# Patient Record
Sex: Male | Born: 1970 | Race: Black or African American | Hispanic: No | Smoking: Current some day smoker
Health system: Southern US, Community
[De-identification: ages and names within clinical notes are randomized; demographics above are authoritative.]

## PROBLEM LIST (undated history)

## (undated) DIAGNOSIS — J45909 Unspecified asthma, uncomplicated: Secondary | ICD-10-CM

---

## 2001-09-26 ENCOUNTER — Emergency Department (HOSPITAL_COMMUNITY): Admission: EM | Admit: 2001-09-26 | Discharge: 2001-09-26 | Payer: Self-pay | Admitting: Emergency Medicine

## 2001-09-26 ENCOUNTER — Encounter: Payer: Self-pay | Admitting: Emergency Medicine

## 2001-09-27 ENCOUNTER — Ambulatory Visit (HOSPITAL_BASED_OUTPATIENT_CLINIC_OR_DEPARTMENT_OTHER): Admission: RE | Admit: 2001-09-27 | Discharge: 2001-09-28 | Payer: Self-pay | Admitting: *Deleted

## 2002-10-12 ENCOUNTER — Encounter: Payer: Self-pay | Admitting: Emergency Medicine

## 2002-10-12 ENCOUNTER — Emergency Department (HOSPITAL_COMMUNITY): Admission: EM | Admit: 2002-10-12 | Discharge: 2002-10-13 | Payer: Self-pay | Admitting: Physical Therapy

## 2002-10-19 ENCOUNTER — Emergency Department (HOSPITAL_COMMUNITY): Admission: EM | Admit: 2002-10-19 | Discharge: 2002-10-19 | Payer: Self-pay | Admitting: Emergency Medicine

## 2003-08-13 ENCOUNTER — Other Ambulatory Visit: Payer: Self-pay

## 2008-04-03 ENCOUNTER — Emergency Department (HOSPITAL_COMMUNITY): Admission: EM | Admit: 2008-04-03 | Discharge: 2008-04-03 | Payer: Self-pay | Admitting: Emergency Medicine

## 2009-05-02 ENCOUNTER — Emergency Department (HOSPITAL_COMMUNITY): Admission: EM | Admit: 2009-05-02 | Discharge: 2009-05-02 | Payer: Self-pay | Admitting: Emergency Medicine

## 2009-12-11 ENCOUNTER — Emergency Department (HOSPITAL_COMMUNITY)
Admission: EM | Admit: 2009-12-11 | Discharge: 2009-12-11 | Payer: Self-pay | Source: Home / Self Care | Admitting: Emergency Medicine

## 2010-05-24 NOTE — Op Note (Signed)
   NAME:  ARISTIDE, WAGGLE                        ACCOUNT NO.:  [id number]   MEDICAL RECORD NO.:  [id number]                   PATIENT TYPE:  AMB   LOCATION:  DSC                                  FACILITY:  [facility]   PHYSICIAN:  Lowell Bouton, M.D.      DATE OF BIRTH:  [birth year]   DATE OF PROCEDURE:  09/27/2001  DATE OF DISCHARGE:                                 OPERATIVE REPORT   PREOPERATIVE DIAGNOSIS:  Abscess with extensor tenosynovitis, right middle  finger.   POSTOPERATIVE DIAGNOSIS:  Abscess with extensor tenosynovitis, right middle  finger.   PROCEDURE:  Incision and drainage of right middle finger.   SURGEON:  Lowell Bouton, M.D.   ANESTHESIA:  General.   OPERATIVE FINDINGS:  The patient had a typical clenched-fist injury;  however, the wound was over the dorsum of the proximal  phalanx of his right  middle finger.  The wound extended down through the extensor tendon to the  bone and actually made an indentation in the bone, but there was not a  discrete fracture.  There was gross purulent material surrounding a hole in  the extensor mechanism, and it tracked proximally and distally.   DESCRIPTION OF PROCEDURE:  Under general anesthesia with a tourniquet on the  right arm, the right hand was prepped and draped in the usual fashion.  After elevating the limb, the tourniquet was inflated to 225 mmHg.  A  longitudinal incision was made in the midline on the dorsum of the proximal  phalanx of the right middle finger.  Blunt dissection was carried through  the subcutaneous tissues and the hole in the extensor tendon was easily  identifiable.  There was gross purulent material beneath the extensor  mechanism, and cultures were obtained for aerobes and anaerobes.  The finger  was flexed and extended and there was an indentation in the bone, however  not an obvious fracture.  After releasing all the purulent material, the  wound was irrigated copiously  with antibiotic solution.  It was packed with  iodoform packing beneath the tendon to allow for further drainage.  The  distal and proximal end of the wound was closed with 4-0 nylon sutures.  Sterile dressings were applied, followed by an Alumafoam splint, and the  patient tolerated the procedure well and went to the recovery room awake and  stable, in good condition.  A 0.5% Marcaine digital block was given for pain  control following the procedure.                                               Lowell Bouton, M.D.    EMM/MEDQ  D:  09/27/2001  T:  09/28/2001  Job:  [id number]

## 2011-01-21 ENCOUNTER — Encounter (HOSPITAL_COMMUNITY): Payer: Self-pay | Admitting: *Deleted

## 2011-01-21 ENCOUNTER — Emergency Department (HOSPITAL_COMMUNITY): Payer: Medicaid Other

## 2011-01-21 ENCOUNTER — Emergency Department (HOSPITAL_COMMUNITY)
Admission: EM | Admit: 2011-01-21 | Discharge: 2011-01-21 | Disposition: A | Discharge status: Home / Self Care | Payer: Medicaid Other | Attending: Emergency Medicine | Admitting: Emergency Medicine

## 2011-01-21 ENCOUNTER — Other Ambulatory Visit: Payer: Self-pay

## 2011-01-21 DIAGNOSIS — M25529 Pain in unspecified elbow: Secondary | ICD-10-CM

## 2011-01-21 DIAGNOSIS — R29898 Other symptoms and signs involving the musculoskeletal system: Secondary | ICD-10-CM | POA: Insufficient documentation

## 2011-01-21 DIAGNOSIS — R209 Unspecified disturbances of skin sensation: Secondary | ICD-10-CM | POA: Insufficient documentation

## 2011-01-21 DIAGNOSIS — M25519 Pain in unspecified shoulder: Secondary | ICD-10-CM | POA: Insufficient documentation

## 2011-01-21 DIAGNOSIS — R200 Anesthesia of skin: Secondary | ICD-10-CM

## 2011-01-21 LAB — BASIC METABOLIC PANEL
CO2: 27 mEq/L (ref 19–32)
Calcium: 9.2 mg/dL (ref 8.4–10.5)
GFR calc non Af Amer: >90 mL/min (ref >90)
Potassium: 3.6 mEq/L (ref 3.5–5.1)
Sodium: 141 mEq/L (ref 135–145)

## 2011-01-21 LAB — DIFFERENTIAL
Basophils Absolute: 0 10*3/uL (ref 0.0–0.1)
Eosinophils Relative: 4 % (ref 0–5)
Lymphocytes Relative: 36 % (ref 12–46)
Lymphs Abs: 1.7 10*3/uL (ref 0.7–4.0)
Neutrophils Relative %: 52 % (ref 43–77)

## 2011-01-21 LAB — CBC
MCV: 93.5 fL (ref 78.0–100.0)
Platelets: 147 10*3/uL — ABNORMAL LOW (ref 150–400)
RBC: 4.44 MIL/uL (ref 4.22–5.81)
RDW: 13 % (ref 11.5–15.5)
WBC: 4.8 10*3/uL (ref 4.0–10.5)

## 2011-01-21 MED ORDER — OXYCODONE-ACETAMINOPHEN 5-325 MG PO TABS: 1.0000 | ORAL_TABLET | ORAL | Status: AC | PRN

## 2011-01-21 MED ORDER — ASPIRIN 325 MG PO TABS
325.0000 mg | ORAL_TABLET | Freq: Every day | ORAL | Status: DC
  Administered 2011-01-21: 325 mg via ORAL
  Filled 2011-01-21: qty 1

## 2011-01-21 MED ORDER — IBUPROFEN 600 MG PO TABS: 600.0000 mg | ORAL_TABLET | Freq: Four times a day (QID) | ORAL | Status: AC | PRN

## 2011-01-21 MED ORDER — ACETAMINOPHEN 325 MG PO TABS: 650.0000 mg | ORAL_TABLET | ORAL | Status: DC | PRN

## 2011-01-21 MED ORDER — SODIUM CHLORIDE 0.9 % IV SOLN: INTRAVENOUS | Status: DC

## 2011-01-21 NOTE — ED Notes (Signed)
Pt woke up with left shoulder and elbow pain and has pain to raise arm and sts left leg has numb feeling and he woke up with the pain.  No headache or facial deficits

## 2011-01-21 NOTE — ED Provider Notes (Signed)
Pt with numbness to left UE/left LE and reported weakness to left UE this morning He woke up with these symptoms He is awake/alert at this time No arm drift noted but reports numbness No HA/neck/back pain reported BP 127/69  Pulse 80  Temp(Src) 98.5 F (36.9 C) (Oral)  Resp 20  SpO2 99%   Joya Gaskins, MD 01/21/11 1159

## 2011-01-21 NOTE — ED Provider Notes (Signed)
History     CSN: [id number]  Arrival date & time 01/21/11  1057   First MD Initiated Contact with Patient 01/21/11 1146      Chief Complaint  Patient presents with  . Numbness    shoulder and elbow pain    (Consider location/radiation/quality/duration/timing/severity/associated sxs/prior treatment) HPI  Previously healthy presents with multiple complaints. Patient states he woke up at 9:30 this morning and experienced left upper extremity left lower extremity numbness. The numbness is diffuse. He also complains of paresthesias to the same length. He states he has mild pain in his left shoulder and left elbow but no pain in his left lower extremity. Denies headache, dizziness. Denies numbness tingling or weakness in his face. He remains ambulatory. No history of similar. There's been no history of head trauma. He denies back pain, neck pain. Denies known recent tick bite.   ED Notes, ED Provider Notes from 01/21/11 0000 to 01/21/11 11:21:31       Sabrina Tommy Medal, RN 01/21/2011 11:20      Pt woke up with left shoulder and elbow pain and has pain to raise arm and sts left leg has numb feeling and he woke up with the pain. No headache or facial deficits     History reviewed. No pertinent past medical history.  History reviewed. No pertinent past surgical history.  No family history on file.  History  Substance Use Topics  . Smoking status: Current Everyday Smoker  . Smokeless tobacco: Not on file  . Alcohol Use: Yes     occ      Review of Systems  All other systems reviewed and are negative.   except as noted HPI    Allergies  Review of patient's allergies indicates no known allergies.  Home Medications   Current Outpatient Rx  Name Route Sig Dispense Refill  . IBUPROFEN 600 MG PO TABS Oral Take 1 tablet (600 mg total) by mouth every 6 (six) hours as needed for pain. 30 tablet 0  . OXYCODONE-ACETAMINOPHEN 5-325 MG PO TABS Oral Take 1 tablet by mouth every  4 (four) hours as needed for pain. 6 tablet 0    BP 134/95  Pulse 68  Temp(Src) 97.6 F (36.4 C) (Oral)  Resp 16  SpO2 99%  Physical Exam  Nursing note and vitals reviewed. Constitutional: He is oriented to person, place, and time. He appears well-developed and well-nourished. No distress.  HENT:  Head: Atraumatic.  Mouth/Throat: Oropharynx is clear and moist.  Eyes: Conjunctivae are normal. Pupils are equal, round, and reactive to light.  Neck: Neck supple.  Cardiovascular: Normal rate, regular rhythm, normal heart sounds and intact distal pulses.  Exam reveals no gallop and no friction rub.   No murmur heard. Pulmonary/Chest: Effort normal. No respiratory distress. He has no wheezes. He has no rales.  Abdominal: Soft. Bowel sounds are normal. There is no tenderness. There is no rebound and no guarding.  Musculoskeletal: Normal range of motion. He exhibits no edema and no tenderness.       Lt elbow and shoulder with full ROM min pain.   No midline c/t/l/s ttp   Neurological: He is alert and oriented to person, place, and time. No cranial nerve deficit. He exhibits normal muscle tone. Coordination normal.       Strength 5/5 RUE/RLE  4+/5 LUE/LLE all extremities No pronator drift No facial droop Decreased gross sensation diffusely LUE/LLE   Skin: Skin is warm and dry.  Psychiatric: He has a normal  mood and affect.    ED Course  Procedures (including critical care time)  Labs Reviewed  CBC - Abnormal; Notable for the following:    Platelets 147 (*)    All other components within normal limits  DIFFERENTIAL  BASIC METABOLIC PANEL  LAB REPORT - SCANNED   Dg Elbow Complete Left  01/21/2011  *RADIOLOGY REPORT*  Clinical Data: Left elbow pain  LEFT ELBOW - COMPLETE 3+ VIEW  Comparison: None  Findings: No joint effusion.  There is no fracture or subluxation.  No radio-opaque foreign body or soft tissue calcification.  IMPRESSION:  1.  No acute findings identified.  Original  Report Authenticated By: Rosealee Albee, M.D.   Ct Head Wo Contrast  01/21/2011  *RADIOLOGY REPORT*  Clinical Data: Left upper and lower extremity numbness and weakness.  CT HEAD WITHOUT CONTRAST  Technique:  Contiguous axial images were obtained from the base of the skull through the vertex without contrast.  Comparison: 05/02/2009  Findings: There is no acute intracranial infarction, mass lesion, or hemorrhage.  Brain parenchyma is normal.  The osseous structures are normal.  IMPRESSION: Normal exam.  Original Report Authenticated By: Gwynn Burly, M.D.   Mr Brain Wo Contrast  01/21/2011  *RADIOLOGY REPORT*  Clinical Data: 41 year old male with left side weakness.  Left upper and lower extremity involvement.  MRI HEAD WITHOUT CONTRAST  Technique:  Multiplanar, multiecho pulse sequences of the brain and surrounding structures were obtained according to standard protocol without intravenous contrast.  Comparison: Head CTs without contrast 01/21/2011 and earlier.  Findings: Mild scaphocephaly. No restricted diffusion to suggest acute infarction.  No midline shift, mass effect, evidence of mass lesion, ventriculomegaly, extra-axial collection or acute intracranial hemorrhage.  Cervicomedullary junction and pituitary are within normal limits.  Major intracranial vascular flow voids are preserved; the vertebrobasilar junction and or distal left vertebral body are tortuous in the pre medullary cistern. Wallace Cullens and white matter signal is within normal limits throughout the brain. Negative visualized cervical spine.  Normal bone marrow signal.  Visualized orbit soft tissues are within normal limits.  Mild paranasal sinus mucosal thickening.  Mastoids are clear.  Negative scalp soft tissues.  IMPRESSION: Normal noncontrast MRI appearance of the brain.  Original Report Authenticated By: Ulla Potash III, M.D.     1. Numbness   2. Elbow pain     MDM  Patient with atypical numbness/tingling/pain of LUE and LLE.  Suspect weakness is mostly effort related. MRI negative for acute stroke. No neck pain and doubt cervical etiology. XR elbow negative. Patient given referral for follow up with primary care should sx persist. Precautions for return.        Forbes Cellar, MD 01/22/11 1000

## 2011-01-21 NOTE — ED Notes (Signed)
LSN 0230. Awoke this am 0900 with LUE, LLE weakness, numbness, tingling & pain. No other defecits. No facial droop, slurred speech.

## 2011-01-21 NOTE — ED Notes (Signed)
Vascular lab aware of pt.

## 2011-01-21 NOTE — ED Notes (Signed)
Pt states  He woke up w/ left shoulder tingling and pain in elbow this am also states that left leg feels funny.ble to walk but states is slow and has good pulses can make a fist and relax it has good radial pulse

## 2011-01-21 NOTE — ED Notes (Signed)
Patient transported to X-ray 

## 2011-10-04 ENCOUNTER — Encounter (HOSPITAL_COMMUNITY): Payer: Self-pay | Admitting: *Deleted

## 2011-10-04 ENCOUNTER — Emergency Department (HOSPITAL_COMMUNITY)
Admission: EM | Admit: 2011-10-04 | Discharge: 2011-10-05 | Disposition: A | Discharge status: Home / Self Care | Payer: Medicaid Other | Attending: Emergency Medicine | Admitting: Emergency Medicine

## 2011-10-04 DIAGNOSIS — T148XXA Other injury of unspecified body region, initial encounter: Secondary | ICD-10-CM

## 2011-10-04 DIAGNOSIS — X500XXA Overexertion from strenuous movement or load, initial encounter: Secondary | ICD-10-CM | POA: Insufficient documentation

## 2011-10-04 DIAGNOSIS — F17200 Nicotine dependence, unspecified, uncomplicated: Secondary | ICD-10-CM | POA: Insufficient documentation

## 2011-10-04 NOTE — ED Notes (Signed)
The pt has had lower back pain for 3 days with no injury.  He also has pain in both his legs from the knees up.  No previous history.  He has been lefting heavy  objects

## 2011-10-04 NOTE — ED Notes (Signed)
Pt stated that he has been having back pain x 3days. Pain radiates to inner thighs. He was picking up a tv prior to back pain. No numbness and tinglin noted. Pt ambulatory and can move legs. Will continue to monitor.

## 2011-10-05 MED ORDER — CYCLOBENZAPRINE HCL 10 MG PO TABS: 10.0000 mg | ORAL_TABLET | Freq: Two times a day (BID) | ORAL | Status: DC | PRN

## 2011-10-05 MED ORDER — IBUPROFEN 600 MG PO TABS: 600.0000 mg | ORAL_TABLET | Freq: Four times a day (QID) | ORAL | Status: DC | PRN

## 2011-10-05 MED ORDER — KETOROLAC TROMETHAMINE 30 MG/ML IJ SOLN
30.0000 mg | Freq: Once | INTRAMUSCULAR | Status: AC
  Administered 2011-10-05: 30 mg via INTRAMUSCULAR
  Filled 2011-10-05: qty 1

## 2011-10-05 NOTE — ED Provider Notes (Signed)
History     CSN: [id number]  Arrival date & time 10/04/11  2209   First MD Initiated Contact with Patient 10/05/11 0104      Chief Complaint  Patient presents with  . Back Pain    (Consider location/radiation/quality/duration/timing/severity/associated sxs/prior treatment) HPI Comments: After lifting a big screen TV with a friend developed low back pain 3 days ago has not taken any meds applied heat or cold  Patient was sleeping soundly when I entered the room   Patient is a 41 y.o. male presenting with back pain. The history is provided by the patient.  Back Pain  This is a new problem. The current episode started more than 2 days ago. The problem has not changed since onset.The pain is associated with lifting heavy objects. The pain is present in the lumbar spine. The pain radiates to the left thigh and right thigh. The pain is at a severity of 4/10. The pain is moderate. The symptoms are aggravated by bending. Associated symptoms include numbness and dysuria. Pertinent negatives include no fever, no headaches and no weakness.    History reviewed. No pertinent past medical history.  History reviewed. No pertinent past surgical history.  No family history on file.  History  Substance Use Topics  . Smoking status: Current Every Day Smoker  . Smokeless tobacco: Not on file  . Alcohol Use: Yes     occ      Review of Systems  Constitutional: Negative for fever and chills.  Genitourinary: Positive for dysuria. Negative for frequency.  Musculoskeletal: Positive for back pain. Negative for joint swelling and gait problem.  Neurological: Positive for numbness. Negative for dizziness, weakness and headaches.    Allergies  Review of patient's allergies indicates no known allergies.  Home Medications   Current Outpatient Rx  Name Route Sig Dispense Refill  . CYCLOBENZAPRINE HCL 10 MG PO TABS Oral Take 1 tablet (10 mg total) by mouth 2 (two) times daily as needed for muscle  spasms. 20 tablet 0  . IBUPROFEN 600 MG PO TABS Oral Take 1 tablet (600 mg total) by mouth every 6 (six) hours as needed for pain. 30 tablet 0    BP 130/78  Temp 98.8 F (37.1 C) (Oral)  Resp 18  SpO2 99%  Physical Exam  Constitutional: He is oriented to person, place, and time. He appears well-developed and well-nourished.  Eyes: Pupils are equal, round, and reactive to light.  Neck: Normal range of motion.  Pulmonary/Chest: Effort normal.  Abdominal: Soft.  Musculoskeletal: Normal range of motion. He exhibits no edema and no tenderness.       Lumbar back: He exhibits tenderness. He exhibits normal range of motion, no swelling, no edema, no laceration, no pain and no spasm.       Back:       Legs:      normal gait   Neurological: He is alert and oriented to person, place, and time.  Skin: No rash noted.    ED Course  Procedures (including critical care time)  Labs Reviewed - No data to display No results found.   1. Muscle strain       MDM  Muscle strain         Arman Filter, NP 10/05/11 0129

## 2011-10-06 NOTE — ED Provider Notes (Signed)
Medical screening examination/treatment/procedure(s) were performed by non-physician practitioner and as supervising physician I was immediately available for consultation/collaboration.  Juliet Rude. Rubin Payor, MD 10/06/11 2137

## 2011-10-27 ENCOUNTER — Emergency Department (HOSPITAL_COMMUNITY): Payer: No Typology Code available for payment source

## 2011-10-27 ENCOUNTER — Encounter (HOSPITAL_COMMUNITY): Payer: Self-pay | Admitting: Emergency Medicine

## 2011-10-27 ENCOUNTER — Encounter (HOSPITAL_COMMUNITY): Payer: Self-pay | Admitting: *Deleted

## 2011-10-27 ENCOUNTER — Emergency Department (HOSPITAL_COMMUNITY)
Admission: EM | Admit: 2011-10-27 | Discharge: 2011-10-27 | Disposition: A | Discharge status: Home / Self Care | Payer: No Typology Code available for payment source | Attending: Emergency Medicine | Admitting: Emergency Medicine

## 2011-10-27 DIAGNOSIS — R079 Chest pain, unspecified: Secondary | ICD-10-CM | POA: Insufficient documentation

## 2011-10-27 DIAGNOSIS — Y9389 Activity, other specified: Secondary | ICD-10-CM | POA: Insufficient documentation

## 2011-10-27 DIAGNOSIS — Y939 Activity, unspecified: Secondary | ICD-10-CM | POA: Insufficient documentation

## 2011-10-27 DIAGNOSIS — F17200 Nicotine dependence, unspecified, uncomplicated: Secondary | ICD-10-CM | POA: Insufficient documentation

## 2011-10-27 DIAGNOSIS — M542 Cervicalgia: Secondary | ICD-10-CM | POA: Insufficient documentation

## 2011-10-27 DIAGNOSIS — R51 Headache: Secondary | ICD-10-CM | POA: Insufficient documentation

## 2011-10-27 DIAGNOSIS — S161XXA Strain of muscle, fascia and tendon at neck level, initial encounter: Secondary | ICD-10-CM

## 2011-10-27 DIAGNOSIS — R0781 Pleurodynia: Secondary | ICD-10-CM

## 2011-10-27 DIAGNOSIS — S139XXA Sprain of joints and ligaments of unspecified parts of neck, initial encounter: Secondary | ICD-10-CM | POA: Insufficient documentation

## 2011-10-27 DIAGNOSIS — S20219A Contusion of unspecified front wall of thorax, initial encounter: Secondary | ICD-10-CM

## 2011-10-27 DIAGNOSIS — IMO0002 Reserved for concepts with insufficient information to code with codable children: Secondary | ICD-10-CM | POA: Insufficient documentation

## 2011-10-27 DIAGNOSIS — S46819A Strain of other muscles, fascia and tendons at shoulder and upper arm level, unspecified arm, initial encounter: Secondary | ICD-10-CM

## 2011-10-27 MED ORDER — METHOCARBAMOL 500 MG PO TABS: 500.0000 mg | ORAL_TABLET | Freq: Two times a day (BID) | ORAL | Status: DC

## 2011-10-27 MED ORDER — KETOROLAC TROMETHAMINE 60 MG/2ML IM SOLN
60.0000 mg | Freq: Once | INTRAMUSCULAR | Status: AC
  Administered 2011-10-27: 60 mg via INTRAMUSCULAR
  Filled 2011-10-27: qty 2

## 2011-10-27 MED ORDER — KETOROLAC TROMETHAMINE 60 MG/2ML IM SOLN
30.0000 mg | Freq: Once | INTRAMUSCULAR | Status: AC
  Administered 2011-10-27: 30 mg via INTRAMUSCULAR
  Filled 2011-10-27: qty 2

## 2011-10-27 MED ORDER — KETOROLAC TROMETHAMINE 30 MG/ML IJ SOLN: 30.0000 mg | Freq: Once | INTRAMUSCULAR | Status: DC

## 2011-10-27 MED ORDER — NAPROXEN 500 MG PO TABS: 500.0000 mg | ORAL_TABLET | Freq: Two times a day (BID) | ORAL | Status: DC
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
## 2011-10-27 NOTE — ED Notes (Signed)
To CT via stretcher, alert, NAD, calm, interactive, sitting upright, c-collar in place.
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
## 2011-10-29 ENCOUNTER — Emergency Department (INDEPENDENT_AMBULATORY_CARE_PROVIDER_SITE_OTHER)
Admission: EM | Admit: 2011-10-29 | Discharge: 2011-10-29 | Disposition: A | Discharge status: Home / Self Care | Payer: Self-pay | Source: Home / Self Care | Attending: Family Medicine | Admitting: Family Medicine

## 2011-10-29 ENCOUNTER — Encounter (HOSPITAL_COMMUNITY): Payer: Self-pay | Admitting: *Deleted

## 2011-10-29 DIAGNOSIS — Z041 Encounter for examination and observation following transport accident: Secondary | ICD-10-CM

## 2011-10-29 DIAGNOSIS — IMO0001 Reserved for inherently not codable concepts without codable children: Secondary | ICD-10-CM

## 2011-10-29 MED ORDER — CYCLOBENZAPRINE HCL 5 MG PO TABS: 5.0000 mg | ORAL_TABLET | Freq: Three times a day (TID) | ORAL | Status: DC | PRN
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
## 2012-02-24 ENCOUNTER — Encounter (HOSPITAL_COMMUNITY): Payer: Self-pay | Admitting: *Deleted

## 2012-02-24 ENCOUNTER — Emergency Department (INDEPENDENT_AMBULATORY_CARE_PROVIDER_SITE_OTHER)
Admission: EM | Admit: 2012-02-24 | Discharge: 2012-02-24 | Disposition: A | Discharge status: Home / Self Care | Payer: Self-pay | Source: Home / Self Care | Attending: Emergency Medicine | Admitting: Emergency Medicine

## 2012-02-24 DIAGNOSIS — S139XXA Sprain of joints and ligaments of unspecified parts of neck, initial encounter: Secondary | ICD-10-CM

## 2012-02-24 DIAGNOSIS — S39012A Strain of muscle, fascia and tendon of lower back, initial encounter: Secondary | ICD-10-CM

## 2012-02-24 DIAGNOSIS — S161XXA Strain of muscle, fascia and tendon at neck level, initial encounter: Secondary | ICD-10-CM

## 2012-02-24 DIAGNOSIS — S46019A Strain of muscle(s) and tendon(s) of the rotator cuff of unspecified shoulder, initial encounter: Secondary | ICD-10-CM

## 2012-02-24 MED ORDER — TRAMADOL HCL 50 MG PO TABS: 100.0000 mg | ORAL_TABLET | Freq: Three times a day (TID) | ORAL | Status: DC | PRN

## 2012-02-24 MED ORDER — MELOXICAM 15 MG PO TABS: 15.0000 mg | ORAL_TABLET | Freq: Every day | ORAL | Status: DC

## 2012-02-24 MED ORDER — METHOCARBAMOL 500 MG PO TABS: 500.0000 mg | ORAL_TABLET | Freq: Three times a day (TID) | ORAL | Status: DC
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
## 2012-04-06 ENCOUNTER — Encounter (HOSPITAL_COMMUNITY): Payer: Self-pay | Admitting: *Deleted

## 2012-04-06 ENCOUNTER — Emergency Department (HOSPITAL_COMMUNITY)
Admission: EM | Admit: 2012-04-06 | Discharge: 2012-04-06 | Disposition: A | Discharge status: Home / Self Care | Payer: Medicaid Other | Attending: Emergency Medicine | Admitting: Emergency Medicine

## 2012-04-06 DIAGNOSIS — Z202 Contact with and (suspected) exposure to infections with a predominantly sexual mode of transmission: Secondary | ICD-10-CM

## 2012-04-06 DIAGNOSIS — F17200 Nicotine dependence, unspecified, uncomplicated: Secondary | ICD-10-CM | POA: Insufficient documentation

## 2012-04-06 LAB — URINALYSIS, ROUTINE W REFLEX MICROSCOPIC
Nitrite: NEGATIVE
Specific Gravity, Urine: >1.03 — ABNORMAL HIGH (ref 1.005–1.030)
Urobilinogen, UA: 0.2 mg/dL (ref 0.0–1.0)
pH: 6 (ref 5.0–8.0)

## 2012-04-06 MED ORDER — METRONIDAZOLE 500 MG PO TABS
2000.0000 mg | ORAL_TABLET | Freq: Once | ORAL | Status: AC
  Administered 2012-04-06: 2000 mg via ORAL
  Filled 2012-04-06: qty 4

## 2012-04-06 NOTE — ED Notes (Signed)
Pt presents to er with request for evaluation of trichomonas, was advised by a girlfriend that she was treated for trichomonas on 04/03/2012. Pt denies any symptoms at present,

## 2012-04-08 LAB — GC/CHLAMYDIA PROBE AMP
CT Probe RNA: NEGATIVE
GC Probe RNA: NEGATIVE
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
## 2012-04-10 NOTE — ED Provider Notes (Signed)
Medical screening examination/treatment/procedure(s) were performed by non-physician practitioner and as supervising physician I was immediately available for consultation/collaboration.   Naysa Puskas M Dishawn Bhargava, DO 04/10/12 1348 
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
## 2014-01-25 ENCOUNTER — Encounter (HOSPITAL_COMMUNITY): Payer: Self-pay | Admitting: Physical Medicine and Rehabilitation

## 2014-01-25 ENCOUNTER — Emergency Department (HOSPITAL_COMMUNITY)
Admission: EM | Admit: 2014-01-25 | Discharge: 2014-01-25 | Disposition: A | Discharge status: Home / Self Care | Payer: Medicaid Other | Attending: Emergency Medicine | Admitting: Emergency Medicine

## 2014-01-25 DIAGNOSIS — A64 Unspecified sexually transmitted disease: Secondary | ICD-10-CM | POA: Insufficient documentation

## 2014-01-25 DIAGNOSIS — Z720 Tobacco use: Secondary | ICD-10-CM | POA: Insufficient documentation

## 2014-01-25 DIAGNOSIS — R300 Dysuria: Secondary | ICD-10-CM | POA: Diagnosis present

## 2014-01-25 LAB — URINALYSIS, ROUTINE W REFLEX MICROSCOPIC
BILIRUBIN URINE: NEGATIVE
Glucose, UA: NEGATIVE mg/dL
HGB URINE DIPSTICK: NEGATIVE
Ketones, ur: NEGATIVE mg/dL
NITRITE: NEGATIVE
PH: 6 (ref 5.0–8.0)
Protein, ur: NEGATIVE mg/dL
SPECIFIC GRAVITY, URINE: 1.028 (ref 1.005–1.030)
UROBILINOGEN UA: 1 mg/dL (ref 0.0–1.0)

## 2014-01-25 LAB — URINE MICROSCOPIC-ADD ON

## 2014-01-25 MED ORDER — LIDOCAINE HCL (PF) 1 % IJ SOLN
INTRAMUSCULAR | Status: AC
  Administered 2014-01-25: 5 mL
  Filled 2014-01-25: qty 5

## 2014-01-25 MED ORDER — CEFTRIAXONE SODIUM 250 MG IJ SOLR
250.0000 mg | Freq: Once | INTRAMUSCULAR | Status: AC
  Administered 2014-01-25: 250 mg via INTRAMUSCULAR
  Filled 2014-01-25: qty 250

## 2014-01-25 MED ORDER — AZITHROMYCIN 250 MG PO TABS
1000.0000 mg | ORAL_TABLET | Freq: Once | ORAL | Status: AC
  Administered 2014-01-25: 1000 mg via ORAL
  Filled 2014-01-25: qty 4

## 2014-01-25 NOTE — ED Notes (Signed)
Pt states girlfriend told him she could have STD, pt reports dysuria and difficulty urinating x2 days. Denies penile discharge.

## 2014-01-25 NOTE — ED Provider Notes (Signed)
CSN: [id number]     Arrival date & time 01/25/14  1418 History  This chart was scribed for Hoy Morn, MD by Molli Posey, ED Scribe. This patient was seen in room TR09C/TR09C and the patient's care was started 3:21 PM.     Chief Complaint  Patient presents with  . Dysuria  . Exposure to STD   The history is provided by the patient. No language interpreter was used.   HPI Comments: Sean Mcmahon is a 44 y.o. male who presents to the Emergency Department complaining of dysuria that started yesterday. Pt describes the sensation as a tingling feeling and says it occurs every time he urinates. Pt reports associated itchiness. He states he experienced some left sided back pain this morning that was new as well. Pt reports no new sexual partners. He states he has one current sexual partner that he has had for the last year, but could have had an exposure to an STD. Pt reports he has not used any new soaps or detergents. Pt denies penile discharge, testicular pain and abdominal pain.   History reviewed. No pertinent past medical history. History reviewed. No pertinent past surgical history. History reviewed. No pertinent family history. History  Substance Use Topics  . Smoking status: Current Every Day Smoker -- 0.25 packs/day    Types: Cigarettes  . Smokeless tobacco: Not on file  . Alcohol Use: Yes     Comment: occ    Review of Systems  Genitourinary: Positive for dysuria.  All other systems reviewed and are negative.   Allergies  Bee venom  Home Medications   Prior to Admission medications   Not on File   BP 141/80 mmHg  Pulse 98  Temp(Src) 98.1 F (36.7 C) (Oral)  Resp 18  Ht 5' 9"  (1.753 m)  Wt 215 lb (97.523 kg)  BMI 31.74 kg/m2  SpO2 100% Physical Exam  Constitutional: He is oriented to person, place, and time. He appears well-developed and well-nourished.  HENT:  Head: Normocephalic and atraumatic.  Eyes: Right eye exhibits no discharge. Left eye exhibits  no discharge.  Neck: Neck supple. No tracheal deviation present.  Cardiovascular: Normal rate.   Pulmonary/Chest: Effort normal. No respiratory distress.  Abdominal: He exhibits no distension.  Genitourinary: Testes normal and penis normal. Circumcised.  No redness swelling or drainage noted.   Neurological: He is alert and oriented to person, place, and time.  Skin: Skin is warm and dry.  Psychiatric: He has a normal mood and affect. His behavior is normal.  Nursing note and vitals reviewed.   ED Course  Procedures   DIAGNOSTIC STUDIES: Oxygen Saturation is 100% on RA, normal by my interpretation.    COORDINATION OF CARE: 3:24 PM Discussed treatment plan with pt at bedside and pt agreed to plan.   Labs Review Labs Reviewed  URINALYSIS, ROUTINE W REFLEX MICROSCOPIC - Abnormal; Notable for the following:    Color, Urine AMBER (*)    Leukocytes, UA TRACE (*)    All other components within normal limits  URINE CULTURE  URINE MICROSCOPIC-ADD ON  GC/CHLAMYDIA PROBE AMP ([place])    Imaging Review No results found.   EKG Interpretation None      MDM   Final diagnoses:  STD (sexually transmitted disease)   44 yo with dysuria and possible exposure to STD. Discussed importance of using protection when sexually active. Pt understands that they have GC/Chlamydia cultures pending and that they will need to inform all sexual partners  if results return positive. Pt has been treated prophylacticly with azithromycin and rocephin due to pt's history. He has no testicular pain or swelling and no abd pain. Pt is well-appearing, in no acute distress and vital signs reviewed and non-concerning. They appear safe to be discharged.  Discharge include follow-up with Health Department.  Return precautions provided.    I personally performed the services described in this documentation, which was scribed in my presence. The recorded information has been reviewed and is accurate.  Filed  Vitals:   01/25/14 1422 01/25/14 1541  BP: 141/80 132/77  Pulse: 98 82  Temp: 98.1 F (36.7 C) 99 F (37.2 C)  TempSrc: Oral Oral  Resp: 18 16  Height: 5' 9"  (1.753 m)   Weight: 215 lb (97.523 kg)   SpO2: 100% 99%   Meds given in ED:  Medications  cefTRIAXone (ROCEPHIN) injection 250 mg (250 mg Intramuscular Given 01/25/14 1549)  azithromycin (ZITHROMAX) tablet 1,000 mg (1,000 mg Oral Given 01/25/14 1549)  lidocaine (PF) (XYLOCAINE) 1 % injection (5 mLs  Given 01/25/14 1549)    There are no discharge medications for this patient.      Britt Bottom, NP 01/26/14 [place], MD 01/26/14 [phone]

## 2014-01-25 NOTE — Discharge Instructions (Signed)
Follow the directions provided. You've been treated for sexual transmitted disease today however the results of those tests will not come back for several days. If they're positive you will should receive a phone call to tell you the results. If the test results are positive you need to tell any sexual partners you have. Please always wear protection when having sexual intercourse. You may take Tylenol or ibuprofen for any pain or discomfort. Don't hesitate to return for any new, worsening, or concerning symptoms.   WHEN SHOULD I GET IMMEDIATE MEDICAL CARE?  Contact your health care provider right away if:  You have severe abdominal pain.  You are a man and notice swelling or pain in your testicles.  SEEK IMMEDIATE MEDICAL CARE IF:  Back pain develops.  A fever develops.  There is nausea (feeling sick to your stomach) or vomiting (throwing up).  Problems are no better with medications or are getting worse.

## 2014-01-26 LAB — GC/CHLAMYDIA PROBE AMP (~~LOC~~) NOT AT ARMC
CHLAMYDIA, DNA PROBE: NEGATIVE
Neisseria Gonorrhea: NEGATIVE

## 2014-01-29 LAB — URINE CULTURE: Colony Count: 25000

## 2014-01-31 ENCOUNTER — Telehealth (HOSPITAL_COMMUNITY): Payer: Self-pay

## 2014-01-31 NOTE — Telephone Encounter (Signed)
Post ED Visit - Positive Culture Follow-up  Culture report reviewed by antimicrobial stewardship pharmacist: []  [place], Pharm.D., BCPS [x]  Heide Guile, Pharm.D., BCPS []  Alycia Rossetti, Pharm.D., BCPS []  [place], Pharm.D., BCPS, AAHIVP []  Legrand Como, Pharm.D., BCPS, AAHIVP []  Isac Sarna, Pharm.D., BCPS  Positive Urine culture, 25,000 colonies -> Staph species No further patient follow-up is required at this time.  Dortha Kern 01/31/2014, 6:11 AM

## 2014-08-04 ENCOUNTER — Encounter (HOSPITAL_COMMUNITY): Payer: Self-pay | Admitting: Emergency Medicine

## 2014-08-04 ENCOUNTER — Emergency Department (HOSPITAL_COMMUNITY)
Admission: EM | Admit: 2014-08-04 | Discharge: 2014-08-04 | Disposition: A | Discharge status: Home / Self Care | Payer: Medicaid Other | Attending: Emergency Medicine | Admitting: Emergency Medicine

## 2014-08-04 DIAGNOSIS — Z202 Contact with and (suspected) exposure to infections with a predominantly sexual mode of transmission: Secondary | ICD-10-CM | POA: Diagnosis not present

## 2014-08-04 DIAGNOSIS — J069 Acute upper respiratory infection, unspecified: Secondary | ICD-10-CM | POA: Insufficient documentation

## 2014-08-04 DIAGNOSIS — R369 Urethral discharge, unspecified: Secondary | ICD-10-CM | POA: Diagnosis present

## 2014-08-04 DIAGNOSIS — Z711 Person with feared health complaint in whom no diagnosis is made: Secondary | ICD-10-CM

## 2014-08-04 DIAGNOSIS — J45909 Unspecified asthma, uncomplicated: Secondary | ICD-10-CM | POA: Insufficient documentation

## 2014-08-04 DIAGNOSIS — Z720 Tobacco use: Secondary | ICD-10-CM | POA: Insufficient documentation

## 2014-08-04 DIAGNOSIS — B9789 Other viral agents as the cause of diseases classified elsewhere: Secondary | ICD-10-CM

## 2014-08-04 HISTORY — DX: Unspecified asthma, uncomplicated: J45.909

## 2014-08-04 LAB — URINALYSIS, ROUTINE W REFLEX MICROSCOPIC
GLUCOSE, UA: NEGATIVE mg/dL
Ketones, ur: 15 mg/dL — AB
NITRITE: NEGATIVE
Protein, ur: 30 mg/dL — AB
Specific Gravity, Urine: 1.034 — ABNORMAL HIGH (ref 1.005–1.030)
UROBILINOGEN UA: 1 mg/dL (ref 0.0–1.0)
pH: 6 (ref 5.0–8.0)

## 2014-08-04 LAB — URINE MICROSCOPIC-ADD ON

## 2014-08-04 MED ORDER — ACETAMINOPHEN 500 MG PO TABS
1000.0000 mg | ORAL_TABLET | Freq: Once | ORAL | Status: AC
  Administered 2014-08-04: 1000 mg via ORAL
  Filled 2014-08-04: qty 2

## 2014-08-04 MED ORDER — FLUTICASONE PROPIONATE 50 MCG/ACT NA SUSP: 2.0000 | Freq: Every day | NASAL | Status: AC

## 2014-08-04 MED ORDER — AZITHROMYCIN 250 MG PO TABS
1000.0000 mg | ORAL_TABLET | Freq: Once | ORAL | Status: AC
  Administered 2014-08-04: 1000 mg via ORAL
  Filled 2014-08-04: qty 4

## 2014-08-04 MED ORDER — CEFTRIAXONE SODIUM 250 MG IJ SOLR
250.0000 mg | Freq: Once | INTRAMUSCULAR | Status: AC
  Administered 2014-08-04: 250 mg via INTRAMUSCULAR
  Filled 2014-08-04: qty 250

## 2014-08-04 MED ORDER — BENZONATATE 100 MG PO CAPS: 100.0000 mg | ORAL_CAPSULE | Freq: Three times a day (TID) | ORAL | Status: DC

## 2014-08-04 NOTE — ED Notes (Signed)
Pt. reports penile discharge ( yellow) and nasal congestion onset today .

## 2014-08-04 NOTE — Discharge Instructions (Signed)
1. Medications: mucinex, flonase, tessalon, usual home medications 2. Treatment: rest, drink plenty of fluids, use a condom with every sexual encounter, tylenol for fever control 3. Follow Up: Please followup with your primary doctor in 3 days for discussion of your diagnoses and further evaluation after today's visit; if you do not have a primary care doctor use the resource guide provided to find one; Please return to the ER for worsening symptoms, high fevers difficulty breathing or persistent vomiting.  You have been tested for HIV, syphilis, chlamydia and gonorrhea.  These results will be available in approximately 3 days.  Please inform all sexual partners if you test positive for any of these diseases.  Upper Respiratory Infection, Adult An upper respiratory infection (URI) is also sometimes known as the common cold. The upper respiratory tract includes the nose, sinuses, throat, trachea, and bronchi. Bronchi are the airways leading to the lungs. Most people improve within 1 week, but symptoms can last up to 2 weeks. A residual cough may last even longer.  CAUSES Many different viruses can infect the tissues lining the upper respiratory tract. The tissues become irritated and inflamed and often become very moist. Mucus production is also common. A cold is contagious. You can easily spread the virus to others by oral contact. This includes kissing, sharing a glass, coughing, or sneezing. Touching your mouth or nose and then touching a surface, which is then touched by another person, can also spread the virus. SYMPTOMS  Symptoms typically develop 1 to 3 days after you come in contact with a cold virus. Symptoms vary from person to person. They may include:  Runny nose.  Sneezing.  Nasal congestion.  Sinus irritation.  Sore throat.  Loss of voice (laryngitis).  Cough.  Fatigue.  Muscle aches.  Loss of appetite.  Headache.  Low-grade fever. DIAGNOSIS  You might diagnose your  own cold based on familiar symptoms, since most people get a cold 2 to 3 times a year. Your caregiver can confirm this based on your exam. Most importantly, your caregiver can check that your symptoms are not due to another disease such as strep throat, sinusitis, pneumonia, asthma, or epiglottitis. Blood tests, throat tests, and X-rays are not necessary to diagnose a common cold, but they may sometimes be helpful in excluding other more serious diseases. Your caregiver will decide if any further tests are required. RISKS AND COMPLICATIONS  You may be at risk for a more severe case of the common cold if you smoke cigarettes, have chronic heart disease (such as heart failure) or lung disease (such as asthma), or if you have a weakened immune system. The very young and very old are also at risk for more serious infections. Bacterial sinusitis, middle ear infections, and bacterial pneumonia can complicate the common cold. The common cold can worsen asthma and chronic obstructive pulmonary disease (COPD). Sometimes, these complications can require emergency medical care and may be life-threatening. PREVENTION  The best way to protect against getting a cold is to practice good hygiene. Avoid oral or hand contact with people with cold symptoms. Wash your hands often if contact occurs. There is no clear evidence that vitamin C, vitamin E, echinacea, or exercise reduces the chance of developing a cold. However, it is always recommended to get plenty of rest and practice good nutrition. TREATMENT  Treatment is directed at relieving symptoms. There is no cure. Antibiotics are not effective, because the infection is caused by a virus, not by bacteria. Treatment may include:  Increased fluid intake. Sports drinks offer valuable electrolytes, sugars, and fluids.  Breathing heated mist or steam (vaporizer or shower).  Eating chicken soup or other clear broths, and maintaining good nutrition.  Getting plenty of  rest.  Using gargles or lozenges for comfort.  Controlling fevers with ibuprofen or acetaminophen as directed by your caregiver.  Increasing usage of your inhaler if you have asthma. Zinc gel and zinc lozenges, taken in the first 24 hours of the common cold, can shorten the duration and lessen the severity of symptoms. Pain medicines may help with fever, muscle aches, and throat pain. A variety of non-prescription medicines are available to treat congestion and runny nose. Your caregiver can make recommendations and may suggest nasal or lung inhalers for other symptoms.  HOME CARE INSTRUCTIONS   Only take over-the-counter or prescription medicines for pain, discomfort, or fever as directed by your caregiver.  Use a warm mist humidifier or inhale steam from a shower to increase air moisture. This may keep secretions moist and make it easier to breathe.  Drink enough water and fluids to keep your urine clear or pale yellow.  Rest as needed.  Return to work when your temperature has returned to normal or as your caregiver advises. You may need to stay home longer to avoid infecting others. You can also use a face mask and careful hand washing to prevent spread of the virus. SEEK MEDICAL CARE IF:   After the first few days, you feel you are getting worse rather than better.  You need your caregiver's advice about medicines to control symptoms.  You develop chills, worsening shortness of breath, or brown or red sputum. These may be signs of pneumonia.  You develop yellow or brown nasal discharge or pain in the face, especially when you bend forward. These may be signs of sinusitis.  You develop a fever, swollen neck glands, pain with swallowing, or white areas in the back of your throat. These may be signs of strep throat. SEEK IMMEDIATE MEDICAL CARE IF:   You have a fever.  You develop severe or persistent headache, ear pain, sinus pain, or chest pain.  You develop wheezing, a  prolonged cough, cough up blood, or have a change in your usual mucus (if you have chronic lung disease).  You develop sore muscles or a stiff neck. Document Released: 06/18/2000 Document Revised: 03/17/2011 Document Reviewed: 03/30/2013 [facility] Patient Information 2015 [place], [place]. This information is not intended to replace advice given to you by your health care provider. Make sure you discuss any questions you have with your health care provider.

## 2014-08-04 NOTE — ED Provider Notes (Signed)
CSN: [id number]     Arrival date & time 08/04/14  2238 History  This chart was scribed for Abigail Butts, PA-C, working with Serita Grit, MD by Steva Colder, ED Scribe. The patient was seen in room TR06C/TR06C at 10:58 PM.    Chief Complaint  Patient presents with  . Penile Discharge  . Nasal Congestion      The history is provided by the patient. No language interpreter was used.    HPI Comments: Sean Mcmahon is a 44 y.o. male who presents to the Emergency Department complaining of yellow penile discharge onset last night. He reports that he is sexually active and has had one sexual partner who is his fiancee in the past 6 months. Pt denies ever having an STD. He states that he is having associated symptoms of dysuria and penile pain. He denies hematuria, abdominal pain, testicular pain, painful bowel movement, penile lesions, and any other symptoms.  Pt secondarily complains of nasal congestion onset 2 days. Pt has had positive sick contacts. Pt notes that he is having associated symptoms of sinus pressure and cough. Pt has not tried any medications for the relief of his symptoms. Pt denies ear pain, sore throat, trouble breathing, and any other symptoms.  Denies recent travel.    Past Medical History  Diagnosis Date  . Asthma    History reviewed. No pertinent past surgical history. No family history on file. History  Substance Use Topics  . Smoking status: Current Every Day Smoker -- 0.25 packs/day    Types: Cigarettes  . Smokeless tobacco: Not on file  . Alcohol Use: Yes     Comment: occ    Review of Systems  Constitutional: Negative for fever, chills, appetite change and fatigue.  HENT: Positive for congestion, postnasal drip, rhinorrhea, sinus pressure and sore throat. Negative for ear discharge, ear pain and mouth sores.   Eyes: Negative for visual disturbance.  Respiratory: Positive for cough. Negative for chest tightness, shortness of breath, wheezing and  stridor.   Cardiovascular: Negative for chest pain, palpitations and leg swelling.  Gastrointestinal: Negative for nausea, vomiting, abdominal pain and diarrhea.  Genitourinary: Positive for dysuria, discharge and penile pain. Negative for urgency, frequency, hematuria, genital sores and testicular pain.  Musculoskeletal: Negative for myalgias, back pain, arthralgias and neck stiffness.  Skin: Negative for rash.  Neurological: Negative for syncope, light-headedness, numbness and headaches.  Hematological: Negative for adenopathy.  Psychiatric/Behavioral: The patient is not nervous/anxious.   All other systems reviewed and are negative.     Allergies  Bee venom  Home Medications   Prior to Admission medications   Medication Sig Start Date End Date Taking? Authorizing Provider  benzonatate (TESSALON) 100 MG capsule Take 1 capsule (100 mg total) by mouth every 8 (eight) hours. 08/04/14   Hawraa Stambaugh, PA-C  fluticasone (FLONASE) 50 MCG/ACT nasal spray Place 2 sprays into both nostrils daily. 08/04/14   Kasson Lamere, PA-C   BP 120/83 mmHg  Pulse 98  Temp(Src) 100.4 F (38 C) (Oral)  Resp 16  Ht 5' 9"  (1.753 m)  Wt 202 lb (91.627 kg)  BMI 29.82 kg/m2  SpO2 99% Physical Exam  Constitutional: He is oriented to person, place, and time. He appears well-developed and well-nourished. No distress.  Awake, alert, nontoxic appearance  HENT:  Head: Normocephalic and atraumatic.  Right Ear: Tympanic membrane, external ear and ear canal normal.  Left Ear: Tympanic membrane, external ear and ear canal normal.  Nose: Mucosal edema and rhinorrhea present.  No epistaxis. Right sinus exhibits no maxillary sinus tenderness and no frontal sinus tenderness. Left sinus exhibits no maxillary sinus tenderness and no frontal sinus tenderness.  Mouth/Throat: Uvula is midline, oropharynx is clear and moist and mucous membranes are normal. Mucous membranes are not pale and not cyanotic. No  oropharyngeal exudate, posterior oropharyngeal edema, posterior oropharyngeal erythema or tonsillar abscesses.  Eyes: Conjunctivae are normal. Pupils are equal, round, and reactive to light. No scleral icterus.  Neck: Normal range of motion and full passive range of motion without pain. Neck supple.  Cardiovascular: Normal rate, regular rhythm, normal heart sounds and intact distal pulses.   No murmur heard. Pulmonary/Chest: Effort normal and breath sounds normal. No stridor. No respiratory distress. He has no wheezes.  Equal chest expansion Clear and equal breath sounds  Abdominal: Soft. Bowel sounds are normal. He exhibits no mass. There is no tenderness. There is no rebound and no guarding. Hernia confirmed negative in the right inguinal area and confirmed negative in the left inguinal area.  Genitourinary: Testes normal. Cremasteric reflex is present. Circumcised. Discharge (yellow green, moderate amount) found.  Musculoskeletal: Normal range of motion. He exhibits no edema.  Lymphadenopathy:    He has no cervical adenopathy.       Right: No inguinal adenopathy present.       Left: No inguinal adenopathy present.  Neurological: He is alert and oriented to person, place, and time.  Speech is clear and goal oriented Moves extremities without ataxia  Skin: Skin is warm and dry. No rash noted. He is not diaphoretic.  Psychiatric: He has a normal mood and affect.  Nursing note and vitals reviewed.   ED Course  Procedures (including critical care time) DIAGNOSTIC STUDIES: Oxygen Saturation is 99% on RA, nl by my interpretation.    COORDINATION OF CARE: 11:04 PM-Discussed treatment plan with pt at bedside and pt agreed to plan.   Labs Review Labs Reviewed  URINALYSIS, ROUTINE W REFLEX MICROSCOPIC (NOT AT [facility]) - Abnormal; Notable for the following:    Color, Urine AMBER (*)    APPearance CLOUDY (*)    Specific Gravity, Urine 1.034 (*)    Hgb urine dipstick TRACE (*)    Bilirubin  Urine SMALL (*)    Ketones, ur 15 (*)    Protein, ur 30 (*)    Leukocytes, UA LARGE (*)    All other components within normal limits  URINE MICROSCOPIC-ADD ON  RPR  HIV ANTIBODY (ROUTINE TESTING)  GC/CHLAMYDIA PROBE AMP ([place]) NOT AT [facility]    Imaging Review No results found.   EKG Interpretation None      MDM   Final diagnoses:  Concern about STD in male without diagnosis  Penile discharge  Viral URI with cough   Sean Mcmahon presents with symptoms of URI.  Patients symptoms are consistent with URI, likely viral etiology. Clear and equal breath sounds, afebrile; no indication for CXR.  Discussed that antibiotics are not indicated for viral infections. Pt will be discharged with symptomatic treatment.  Low grade fever noted and treated in the ED.  Pt also with penile discharge. Patient is afebrile without abdominal tenderness, abdominal pain or painful bowel movements to indicate prostatitis.  No tenderness to palpation of the testes or epididymis to suggest orchitis or epididymitis.  STD cultures obtained including HIV, syphilis, gonorrhea and chlamydia. Patient to be discharged with instructions to follow up with PCP. Discussed importance of using protection when sexually active. Pt understands that they have GC/Chlamydia  cultures pending and that they will need to inform all sexual partners if results return positive. Patient has been treated prophylactically with azithromycin and Rocephin.   BP 120/83 mmHg  Pulse 98  Temp(Src) 100.4 F (38 C) (Oral)  Resp 16  Ht 5' 9"  (1.753 m)  Wt 202 lb (91.627 kg)  BMI 29.82 kg/m2  SpO2 99%  I personally performed the services described in this documentation, which was scribed in my presence. The recorded information has been reviewed and is accurate.    Abigail Butts, PA-C 08/04/14 [place], PA-C 08/04/14 [id number]  Serita Grit, MD 08/05/14 [phone]

## 2014-08-05 LAB — HIV ANTIBODY (ROUTINE TESTING W REFLEX): HIV SCREEN 4TH GENERATION: NONREACTIVE

## 2014-08-05 LAB — RPR: RPR Ser Ql: NONREACTIVE

## 2014-08-07 LAB — GC/CHLAMYDIA PROBE AMP (~~LOC~~) NOT AT ARMC
CHLAMYDIA, DNA PROBE: POSITIVE — AB
NEISSERIA GONORRHEA: POSITIVE — AB

## 2014-08-09 ENCOUNTER — Telehealth (HOSPITAL_COMMUNITY): Payer: Self-pay

## 2014-08-09 NOTE — Telephone Encounter (Signed)
Results rcvd from [facility]. Pt contacted,  ID verified x 2.  Pt informed of dx, tx rcvd approp., notify partner(s) for testing and tx., and abstain from sex x 2 wks from tx date. [employer]HS form completed and faxed.

## 2015-02-09 ENCOUNTER — Encounter (HOSPITAL_COMMUNITY): Payer: Self-pay

## 2015-02-09 ENCOUNTER — Emergency Department (HOSPITAL_COMMUNITY)
Admission: EM | Admit: 2015-02-09 | Discharge: 2015-02-09 | Disposition: A | Discharge status: Home / Self Care | Payer: Medicaid Other | Attending: Emergency Medicine | Admitting: Emergency Medicine

## 2015-02-09 DIAGNOSIS — F17210 Nicotine dependence, cigarettes, uncomplicated: Secondary | ICD-10-CM | POA: Insufficient documentation

## 2015-02-09 DIAGNOSIS — J45909 Unspecified asthma, uncomplicated: Secondary | ICD-10-CM | POA: Insufficient documentation

## 2015-02-09 DIAGNOSIS — Z202 Contact with and (suspected) exposure to infections with a predominantly sexual mode of transmission: Secondary | ICD-10-CM | POA: Insufficient documentation

## 2015-02-09 DIAGNOSIS — Z7951 Long term (current) use of inhaled steroids: Secondary | ICD-10-CM | POA: Insufficient documentation

## 2015-02-09 DIAGNOSIS — Z79899 Other long term (current) drug therapy: Secondary | ICD-10-CM | POA: Insufficient documentation

## 2015-02-09 MED ORDER — AZITHROMYCIN 250 MG PO TABS
1000.0000 mg | ORAL_TABLET | Freq: Once | ORAL | Status: AC
  Administered 2015-02-09: 1000 mg via ORAL
  Filled 2015-02-09: qty 4

## 2015-02-09 MED ORDER — CEFTRIAXONE SODIUM 250 MG IJ SOLR
250.0000 mg | Freq: Once | INTRAMUSCULAR | Status: AC
  Administered 2015-02-09: 250 mg via INTRAMUSCULAR
  Filled 2015-02-09: qty 250

## 2015-02-09 MED ORDER — LIDOCAINE HCL 2 % IJ SOLN
INTRAMUSCULAR | Status: AC
  Administered 2015-02-09: 30 mg
  Filled 2015-02-09: qty 20

## 2015-02-09 NOTE — ED Notes (Signed)
Pt reports "my baby's mother was treated for chlamydia 3 days ago."  Pt denies symptoms.  Denies other sexual partners.

## 2015-02-09 NOTE — Discharge Instructions (Signed)
Please follow up with a primary care provider from the Resource Guide provided below in 1 week. You will receive a call from the Hospital with any positive results from your lab work done in the Emergency Department today within the next 24-48 hours. Please return to the Emergency Department if symptoms worsen or new onset of fever, headache, neck stiffness, abdominal pain, chest pain, shortness of breath, pain with urination, penile discharge, penile/testicular pain or swelling, rash.   Emergency Department Resource Guide 1) Find a Doctor and Pay Out of Pocket Although you won't have to find out who is covered by your insurance plan, it is a good idea to ask around and get recommendations. You will then need to call the office and see if the doctor you have chosen will accept you as a new patient and what types of options they offer for patients who are self-pay. Some doctors offer discounts or will set up payment plans for their patients who do not have insurance, but you will need to ask so you aren't surprised when you get to your appointment.  2) Contact Your Local Health Department Not all health departments have doctors that can see patients for sick visits, but many do, so it is worth a call to see if yours does. If you don't know where your local health department is, you can check in your phone book. The CDC also has a tool to help you locate your state's health department, and many state websites also have listings of all of their local health departments.  3) Find a [facility] If your illness is not likely to be very severe or complicated, you may want to try a walk in clinic. These are popping up all over the country in pharmacies, drugstores, and shopping centers. They're usually staffed by nurse practitioners or physician assistants that have been trained to treat common illnesses and complaints. They're usually fairly quick and inexpensive. However, if you have serious medical issues  or chronic medical problems, these are probably not your best option.  No Primary Care Doctor: - Call Health Connect at  [phone] - they can help you locate a primary care doctor that  accepts your insurance, provides certain services, etc. - Physician Referral Service- [phone]  Chronic Pain Problems: Organization         Address  Phone   Notes  [facility]  [phone] Patients need to be referred by their primary care doctor.   Medication Assistance: Organization         Address  Phone   Notes  [facility] Medication [facility] [address] [place], [place] [address] [phone] --Must be a resident of [facility] -- Must have NO insurance coverage whatsoever (no Medicaid/ Medicare, etc.) -- The pt. MUST have a primary care doctor that directs their care regularly and follows them in the community   [facility]  [phone]   [employer]  [phone]    Agencies that provide inexpensive medical care: Organization         Address  Phone   Notes  [place]  [phone]   [facility]    [phone]   [facility] [place], [place] [address] [phone]   [place] [address], [place] [phone]   Planned Parenthood    [phone]   [facility]    [phone]   [place] [address], [place] Phone:  [phone], Fax:  [phone] Hours of Operation:  9 am - 6 pm, M-F.  Also accepts Medicaid/Medicare and self-pay.  [facility] [place], [address], [place] Phone: [phone], Fax: [phone]. Hours of Operation:  8:30 am - 5:30 pm, M-F.  Also accepts Medicaid and self-pay.  [facility] [address] [place] Phone: [phone]   [place], [place], [place]  [phone], Ext. [phone] Mondays & Thursdays: 7-9 AM.  First 15 patients are seen on a first come, first serve basis.    [place] Providers:  Organization         Address  Phone   Notes  [facility] [address], [place] [phone] Also accepts self-pay patients.  [facility] [address], [place]  [phone]   [place], [address], [place] [phone]   [facility] [address] [place] [phone]   Lucianne Lei [address], [place]   [phone] Only accepts [place] Access [place] patients after they have their name applied to their card.   Self-Pay (no insurance) in [facility]:  Organization         Address  Phone   Notes  Sickle Cell Patients, [facility] [place] [phone]   [facility] [place] [phone]   [facility] [place]  [place], [place], [place] [phone]   [facility]/Dr. Osei-Bonsu  [address], [place] or [address], [place] [phone] Phone number for both [place] and [place] locations is the same.  [facility] [address] [place] [phone]   [facility] [address] [place] or [address] [phone]   [facility] [address] [place] [phone], phone; [phone], fax Sees patients 1st and 3rd Saturday of every month.  Must not qualify for public or private insurance (i.e. Medicaid, Medicare, [place] Health Choice, Veterans' Benefits)  Household income should be no more than 200% of the poverty level The clinic cannot treat you if you are pregnant or think you are pregnant  Sexually transmitted  diseases are not treated at the clinic.    Dental Care: Organization         Address  Phone  Notes  [facility] Department of [facility] [phone] Accepts children up to age 10 who are enrolled in [place] or [place]; pregnant women with a Medicaid card; and children who have applied for Medicaid or [place] Health Choice, but were declined, whose parents can pay a reduced fee at time of service.  [facility] of [facility]  [address], [place] [phone] Accepts children up to age 22 who are enrolled in [place] or [place]; pregnant women with a Medicaid card; and children who have applied for Medicaid or [place] Health Choice, but were declined, whose parents can pay a reduced fee at time of service.  [place]  Access PROGRAM  [address] [place] [phone] Patients are seen by appointment only. Walk-ins are not accepted. [place] will see patients 57 years of age and older. Monday - Tuesday (8am-5pm) Most Wednesdays (8:30-5pm) $30 per visit, cash only  [facility] Adult Dental Access PROGRAM  [address], [place] [facility] [phone] Patients are seen by appointment only. Walk-ins are not accepted. [place] will see patients 61 years of age and older. One Wednesday Evening (Monthly: Volunteer Based).  $30 per visit, cash only  [place]  [phone] for adults; Children under age 78, call Graduate Pediatric Dentistry at [phone]. Children aged 60-14, please call [phone] to request a pediatric application.  Dental services are provided in all areas of dental care including fillings, crowns and bridges, complete and partial dentures, implants, gum treatment, root canals, and extractions. Preventive care is also provided. Treatment is provided to both adults and children. Patients are selected via a  lottery and there is often a waiting list.   [facility] [address] [place]  [phone] [email or web address]   [facility] [address] [place], [place] [phone], Ext. [phone] Second and Fourth Thursday of each month, opens at 6:30 AM; Clinic ends at 9 AM.  Patients are seen on a first-come first-served basis, and a limited number are seen during each clinic.   [facility]  [address] Hillard Danker [place], [place] [phone]   Eligibility Requirements You must have lived in [place], [place], or [place] counties for at least the last three months.   You cannot be eligible for state or federal sponsored [employer], including [employer], [place], or [employer].   You generally cannot be eligible for healthcare insurance through your employer.    How to apply: Eligibility screenings are held every Tuesday and Wednesday afternoon from 1:00 pm until 4:00 pm. You do not need an appointment for the interview!  [facility] [address], [place], [place]   [place]  [place] Department  [place]  [phone]    Behavioral Health Resources in the Community: Intensive Outpatient Programs Organization         Address  Phone  Notes  [place]. [address], [place], [place] [phone]   [facility] Outpatient [address], [place], [place]   ADS: Alcohol & Drug Svcs [address], [place], [place]   [place] [address]  [place], [place] or [phone]   Substance Abuse Resources Organization         Address  Phone  Notes  Alcohol and Drug Services  [phone]   [place]  [phone]   [facility] [place]   Chinita Pester  [phone]   Residential &  Outpatient Substance Abuse Program  [phone]   Psychological Services Organization         Address  Phone  Notes  [facility] [place]  [place]  [phone]   [place] [address] [place] or [phone]    Mobile Crisis Teams Organization         Address  Phone  Notes  [facility], Mobile Crisis Care Unit  [phone]   [facility]  [address] [place],  [place] [address], [place] [phone]    Self-Help/Support Groups Organization         Address  Phone             Notes  [facility]ental [facility]oc[facility] of [place] - variety of support groups  House Call for more information  Narcotics Anonymous (NA), [facility] [address], [employer] [place]  2 meetings at this location   Special educational needs teacher         Address  Phone  Notes  [facility] Residential Treatment [place],    [place]  [phone]   [facility]  [address] [place] [phone], [place], [place]   [place], [place] [phone] Admissions: 8am-3pm M-F  Incentives Substance [place] [address],    [place], [place] [phone]   [facility] [address] [place], [place], [place]   [facility] [address],  [place], [place]   [facility] Programs - Intensive Outpatient [address]een [address], [place], [place]   [facility] ([place] [address]  [place], [place] [phone] or [phone]   [facility] Services (RTS) [address], [place], [place] Accepts Medicaid  [facility] [place] [address],  [place] [phone] Substance Abuse/Addiction Treatment   [facility] Organization          Address  Phone  Notes  [facility]  [phone]   Domenic Schwab, PhD [address] Arlis Porta [place], [place]   [phone] or [phone]   [place], [place] [phone]   [facility] [address] [place], [place] [phone] Insurance/Medicaid/sponsorship through [facility] and Families [address] [place]                                    [place], [place] [phone] [place] [address], [place] [phone]    Dr. Adele Schilder  [phone]   [facility] Dept. 1) [address], [place] 2) [place] 3)  [place] [address], [place] [phone]  [phone]   [place] [phone] or [phone] (After Hours)

## 2015-02-09 NOTE — ED Provider Notes (Signed)
CSN: [id number]     Arrival date & time 02/09/15  1222 History  By signing my name below, I, Rayna Sexton, attest that this documentation has been prepared under the direction and in the presence of Nona Dell, [place]. Electronically Signed: Rayna Sexton, ED Scribe. 02/09/2015. 1:21 PM.    Chief Complaint  Patient presents with  . Exposure to STD   The history is provided by the patient. No language interpreter was used.    HPI Comments: Sean Mcmahon is a 45 y.o. male who presents to the Emergency Department requesting an STD screening. Pt reports being sexual activity with 1 male partner who is being treated for Chlamydia beginning 3 days ago. He denies taking any regular medications or any known medical conditions. Pt denies abd pain, fever, chills, neck stiffness, rash, difficulty urinating, penile discharge, dysuria, hematuria, penile swelling, testicular swelling, pain or difficulty with bowel movements or any current symptoms at this time.    Past Medical History  Diagnosis Date  . Asthma    History reviewed. No pertinent past surgical history. History reviewed. No pertinent family history. Social History  Substance Use Topics  . Smoking status: Current Every Day Smoker -- 0.25 packs/day    Types: Cigarettes  . Smokeless tobacco: None  . Alcohol Use: Yes     Comment: occ    Review of Systems  All other systems reviewed and are negative.   Allergies  Bee venom  Home Medications   Prior to Admission medications   Medication Sig Start Date End Date Taking? Authorizing Provider  benzonatate (TESSALON) 100 MG capsule Take 1 capsule (100 mg total) by mouth every 8 (eight) hours. 08/04/14   Hannah Muthersbaugh, PA-C  fluticasone (FLONASE) 50 MCG/ACT nasal spray Place 2 sprays into both nostrils daily. 08/04/14   Hannah Muthersbaugh, PA-C   BP 138/89 mmHg  Pulse 88  Temp(Src) 98.2 F (36.8 C) (Oral)  Resp 16  SpO2 100%    Physical Exam   Constitutional: He is oriented to person, place, and time. He appears well-developed and well-nourished.  HENT:  Head: Normocephalic and atraumatic.  Mouth/Throat: Oropharynx is clear and moist. No oropharyngeal exudate.  Eyes: Conjunctivae and EOM are normal. Right eye exhibits no discharge. Left eye exhibits no discharge. No scleral icterus.  Neck: Normal range of motion. Neck supple.  Cardiovascular: Normal rate, regular rhythm, normal heart sounds and intact distal pulses.  Exam reveals no gallop and no friction rub.   No murmur heard. Pulmonary/Chest: Effort normal and breath sounds normal. No respiratory distress. He has no wheezes. He has no rales. He exhibits no tenderness.  Abdominal: Soft. Bowel sounds are normal. He exhibits no distension and no mass. There is no tenderness. There is no rebound and no guarding. Hernia confirmed negative in the right inguinal area and confirmed negative in the left inguinal area.  Genitourinary: Testes normal. Right testis shows no mass, no swelling and no tenderness. Left testis shows no mass, no swelling and no tenderness. Circumcised. No penile tenderness. Discharge (clear) found.  Chaperone present  Musculoskeletal: Normal range of motion.  Lymphadenopathy:    He has no cervical adenopathy.       Right: No inguinal adenopathy present.       Left: No inguinal adenopathy present.  Neurological: He is alert and oriented to person, place, and time.  Skin: Skin is warm and dry.  Psychiatric: He has a normal mood and affect.  Nursing note and vitals reviewed.   ED  Course  Procedures  DIAGNOSTIC STUDIES: Oxygen Saturation is 100% on RA, normal by my interpretation.    COORDINATION OF CARE: 12:58 PM Pt presents today requesting and STD screening. Discussed next steps with pt. Pt understood and is agreeable with the plan.   Labs Review Labs Reviewed  RPR  HIV ANTIBODY (ROUTINE TESTING)  GC/CHLAMYDIA PROBE AMP ([place]) NOT AT [facility]     Imaging Review No results found. I have personally reviewed and evaluated these lab results as part of my medical decision-making.  Filed Vitals:   02/09/15 1231  BP: 138/89  Pulse: 88  Temp: 98.2 F (36.8 C)  Resp: 16     MDM   Final diagnoses:  STD exposure    Patient is afebrile without abdominal tenderness, abdominal pain or painful bowel movements to indicate prostatitis.  No tenderness to palpation of the testes or epididymis to suggest orchitis or epididymitis. STD cultures obtained including HIV, syphilis, gonorrhea and chlamydia. Patient to be discharged with instructions to follow up with PCP. Discussed importance of using protection when sexually active. Pt understands that they have GC/Chlamydia cultures pending and that they will need to inform all sexual partners if results return positive. Patient has been treated prophylactically with azithromycin and Rocephin.    I personally performed the services described in this documentation, which was scribed in my presence. The recorded information has been reviewed and is accurate.    Chesley Noon [place], [place] 02/09/15 [place], MD 02/12/15 [phone]

## 2015-02-10 LAB — RPR: RPR Ser Ql: NONREACTIVE

## 2015-02-10 LAB — HIV ANTIBODY (ROUTINE TESTING W REFLEX): HIV Screen 4th Generation wRfx: NONREACTIVE

## 2015-02-12 LAB — GC/CHLAMYDIA PROBE AMP (~~LOC~~) NOT AT ARMC
CHLAMYDIA, DNA PROBE: NEGATIVE
NEISSERIA GONORRHEA: NEGATIVE

## 2015-04-20 ENCOUNTER — Encounter (HOSPITAL_COMMUNITY): Payer: Self-pay | Admitting: *Deleted

## 2015-04-20 ENCOUNTER — Ambulatory Visit (HOSPITAL_COMMUNITY)
Admission: EM | Admit: 2015-04-20 | Discharge: 2015-04-20 | Disposition: A | Discharge status: Home / Self Care | Payer: Medicaid Other | Attending: Emergency Medicine | Admitting: Emergency Medicine

## 2015-04-20 DIAGNOSIS — M79672 Pain in left foot: Secondary | ICD-10-CM | POA: Diagnosis not present

## 2015-04-20 DIAGNOSIS — M79671 Pain in right foot: Secondary | ICD-10-CM

## 2015-04-20 DIAGNOSIS — L84 Corns and callosities: Secondary | ICD-10-CM | POA: Diagnosis not present

## 2015-04-20 MED ORDER — TRAMADOL HCL 50 MG PO TABS: 50.0000 mg | ORAL_TABLET | Freq: Four times a day (QID) | ORAL | Status: DC | PRN

## 2015-04-20 NOTE — Discharge Instructions (Signed)
Corns and Calluses Corns are small areas of thickened skin that occur on the top, sides, or tip of a toe. They contain a cone-shaped core with a point that can press on a nerve below. This causes pain. Calluses are areas of thickened skin that can occur anywhere on the body including hands, fingers, palms, soles of the feet, and heels.Calluses are usually larger than corns.  CAUSES  Corns and calluses are caused by rubbing (friction) or pressure, such as from shoes that are too tight or do not fit properly.  RISK FACTORS Corns are more likely to develop in people who have toe deformities, such as hammer toes. Since calluses can occur with friction to any area of the skin, calluses are more likely to develop in people who:   Work with their hands.  Wear shoes that fit poorly, shoes that are too tight, or shoes that are high-heeled.  Have toes deformities. SYMPTOMS Symptoms of a corn or callus include:  A hard growth on the skin.   Pain or tenderness under the skin.   Redness and swelling.   Increased discomfort while wearing tight-fitting shoes. DIAGNOSIS  Corns and calluses may be diagnosed with a medical history and physical exam.  TREATMENT  Corns and calluses may be treated with:  Removing the cause of the friction or pressure. This may include:  Changing your shoes.  Wearing shoe inserts (orthotics) or other protective layers in your shoes, such as a corn pad.  Wearing gloves.  Medicines to help soften skin in the hardened, thickened areas.  Reducing the size of the corn or callus by removing the dead layers of skin.  Antibiotic medicines to treat infection.  Surgery, if a toe deformity is the cause. HOME CARE INSTRUCTIONS   Take medicines only as directed by your health care provider.  If you were prescribed an antibiotic, finish all of it even if you start to feel better.  Wear shoes that fit well. Avoid wearing high-heeled shoes and shoes that are too tight  or too loose.  Wear any padding, protective layers, gloves, or orthotics as directed by your health care provider.  Soak your hands or feet and then use a file or pumice stone to soften your corn or callus. Do this as directed by your health care provider.  Check your corn or callus every day for signs of infection. Watch for:  Redness, swelling, or pain.  Fluid, blood, or pus. SEEK MEDICAL CARE IF:   Your symptoms do not improve with treatment.  You have increased redness, swelling, or pain at the site of your corn or callus.  You have fluid, blood, or pus coming from your corn or callus.  You have new symptoms.   This information is not intended to replace advice given to you by your health care provider. Make sure you discuss any questions you have with your health care provider.   You have calluses without signs of infection. Suggest you use the soaking in epson salt. Also try the pads at [facility] (with the holes). [facility] and make appt. Use Ibuprofen as directed and Tramdol every 6-8 hours as needed.    Document Released: 09/29/2003 Document Revised: 05/09/2014 Document Reviewed: 12/19/2013 Elsevier Interactive Patient Education [employer].

## 2015-04-20 NOTE — ED Provider Notes (Signed)
CSN: [id number]     Arrival date & time 04/20/15  1258 History   First MD Initiated Contact with Patient 04/20/15 1315     Chief Complaint  Patient presents with  . Foot Pain   (Consider location/radiation/quality/duration/timing/severity/associated sxs/prior Treatment) HPI Comments: Sean Mcmahon is a 45 yo male who presents with painful "areas" on the bottom of his feet. Onset x 6 months, worse in the last few weeks. Painful last night and Ibuprofen is not helping. No drainage. No fever or chills. No change in shoes or gait that he is aware. No prior history.   Patient is a 45 y.o. male presenting with lower extremity pain. The history is provided by the patient.  Foot Pain    Past Medical History  Diagnosis Date  . Asthma    History reviewed. No pertinent past surgical history. History reviewed. No pertinent family history. Social History  Substance Use Topics  . Smoking status: Current Every Day Smoker -- 0.25 packs/day    Types: Cigarettes  . Smokeless tobacco: None  . Alcohol Use: Yes     Comment: occ    Review of Systems  All other systems reviewed and are negative.   Allergies  Bee venom  Home Medications   Prior to Admission medications   Medication Sig Start Date End Date Taking? Authorizing Provider  benzonatate (TESSALON) 100 MG capsule Take 1 capsule (100 mg total) by mouth every 8 (eight) hours. 08/04/14   Hannah Muthersbaugh, PA-C  fluticasone (FLONASE) 50 MCG/ACT nasal spray Place 2 sprays into both nostrils daily. 08/04/14   Hannah Muthersbaugh, PA-C  traMADol (ULTRAM) 50 MG tablet Take 1 tablet (50 mg total) by mouth every 6 (six) hours as needed. 04/20/15   Bjorn Pippin, PA-C   Meds Ordered and Administered this Visit  Medications - No data to display  There were no vitals taken for this visit. No data found.   Physical Exam  Constitutional: He is oriented to person, place, and time. He appears well-developed and well-nourished.  Pulmonary/Chest:  Effort normal.  Musculoskeletal: Normal range of motion.  No deformities in the feet are noted. No swelling. No tenderness along the MTP's.  Neurological: He is alert and oriented to person, place, and time.  Skin: Skin is warm and dry.  Quarter size callus along 5th MTP shaft plantar surface bilaterally. No erythema, warmth or drainage. Tender to palpation, but no fluctuant.   Psychiatric: His behavior is normal.  Nursing note and vitals reviewed.   ED Course  Procedures (including critical care time)  Labs Review Labs Reviewed - No data to display  Imaging Review No results found.   Visual Acuity Review  Right Eye Distance:   Left Eye Distance:   Bilateral Distance:    Right Eye Near:   Left Eye Near:    Bilateral Near:         MDM   1. Corns and callosities   2. Foot pain, bilateral    Painful. Suspect either early OA changes in the feet vs.  change in gait or change in shoe wear. At any rate [facility]. No emergent needs today.  May soak in Epson salt, try a holed bunion pad and prn Tramadol only sparingly for pain (15 given). Will most likely need these paired.     Bjorn Pippin, PA-C 04/20/15 1350

## 2015-04-20 NOTE — ED Notes (Signed)
Pt     Reports     Symptoms    Pain  With     Dry  Hard  Lesions  On  Bottom   Of  Both  Feet            symptoms  Worse  Over  The  Last  Week

## 2015-05-02 ENCOUNTER — Ambulatory Visit (INDEPENDENT_AMBULATORY_CARE_PROVIDER_SITE_OTHER): Payer: Medicaid Other | Admitting: Podiatry

## 2015-05-02 ENCOUNTER — Encounter: Payer: Self-pay | Admitting: Podiatry

## 2015-05-02 VITALS — BP 131/92 | HR 74 | Resp 14

## 2015-05-02 DIAGNOSIS — Q667 Congenital pes cavus, unspecified foot: Secondary | ICD-10-CM

## 2015-05-02 DIAGNOSIS — L989 Disorder of the skin and subcutaneous tissue, unspecified: Secondary | ICD-10-CM

## 2015-05-02 NOTE — Progress Notes (Signed)
   Subjective:    Patient ID: Sean Mcmahon, male    DOB: [birth year], 45 y.o.   MRN: [id number]  HPI this patient presents the office with chief complaint of painful calluses on the bottom of both feet. He says he has had callus there for months and has self treated with self debridement. He says that within the last 2 months the pain has gotten worse. He says he went to the emergency care and was given some dispersion pads and told to make an appointment with this office. He says he experiences severe pain and discomfort walking and wearing his shoes at the site of the calluses. He presents the office for an evaluation and treatment of these conditions.  He does admit to taking tramadol for pain.  Review of Systems  All other systems reviewed and are negative.      Objective:   Physical Exam GENERAL APPEARANCE: Alert, conversant. Appropriately groomed. No acute distress.  VASCULAR: Pedal pulses are  palpable at  [facility] and PT bilateral.  Capillary refill time is immediate to all digits,  Normal temperature gradient.  Digital hair growth is present bilateral  NEUROLOGIC: sensation is normal to 5.07 monofilament at 5/5 sites bilateral.  Light touch is intact bilateral, Muscle strength normal.  MUSCULOSKELETAL: acceptable muscle strength, tone and stability bilateral.  Intrinsic muscluature intact bilateral.  Rectus appearance of foot and digits noted bilateral.   DERMATOLOGIC: skin color, texture, and turgor are within normal limits.  No preulcerative lesions or ulcers  are seen, no interdigital maceration noted.  No open lesions present.  Digital nails are asymptomatic. No drainage noted. Callus/porokeratosis plantar aspect left  Fifth midshaft.  There is porokeratosis sub 5th metatarsal right foot.          Assessment & Plan:  Porokeratosis B/L  Plantarflexed fifth metatarsal right foot.  IE  Debridement of porokeratosis.  Dispersion padding dispensed. Consider surgery for met osteotomy  fifth met right foot.   Gardiner Barefoot DPM   RTC prn

## 2015-10-13 ENCOUNTER — Encounter (HOSPITAL_COMMUNITY): Payer: Self-pay | Admitting: Emergency Medicine

## 2015-10-13 ENCOUNTER — Emergency Department (HOSPITAL_COMMUNITY): Payer: Medicaid Other

## 2015-10-13 ENCOUNTER — Emergency Department (HOSPITAL_COMMUNITY)
Admission: EM | Admit: 2015-10-13 | Discharge: 2015-10-13 | Disposition: A | Discharge status: Home / Self Care | Payer: Medicaid Other | Attending: Emergency Medicine | Admitting: Emergency Medicine

## 2015-10-13 DIAGNOSIS — M546 Pain in thoracic spine: Secondary | ICD-10-CM | POA: Diagnosis not present

## 2015-10-13 DIAGNOSIS — Y939 Activity, unspecified: Secondary | ICD-10-CM | POA: Insufficient documentation

## 2015-10-13 DIAGNOSIS — Y999 Unspecified external cause status: Secondary | ICD-10-CM | POA: Insufficient documentation

## 2015-10-13 DIAGNOSIS — M545 Low back pain: Secondary | ICD-10-CM | POA: Insufficient documentation

## 2015-10-13 DIAGNOSIS — Y92410 Unspecified street and highway as the place of occurrence of the external cause: Secondary | ICD-10-CM | POA: Insufficient documentation

## 2015-10-13 DIAGNOSIS — F17210 Nicotine dependence, cigarettes, uncomplicated: Secondary | ICD-10-CM | POA: Diagnosis not present

## 2015-10-13 DIAGNOSIS — J45909 Unspecified asthma, uncomplicated: Secondary | ICD-10-CM | POA: Diagnosis not present

## 2015-10-13 DIAGNOSIS — R51 Headache: Secondary | ICD-10-CM | POA: Insufficient documentation

## 2015-10-13 MED ORDER — CYCLOBENZAPRINE HCL 10 MG PO TABS
10.0000 mg | ORAL_TABLET | Freq: Once | ORAL | Status: AC
  Administered 2015-10-13: 10 mg via ORAL
  Filled 2015-10-13: qty 1

## 2015-10-13 MED ORDER — IBUPROFEN 800 MG PO TABS
800.0000 mg | ORAL_TABLET | Freq: Once | ORAL | Status: AC
  Administered 2015-10-13: 800 mg via ORAL
  Filled 2015-10-13: qty 1

## 2015-10-13 MED ORDER — NAPROXEN 500 MG PO TABS: 500.0000 mg | ORAL_TABLET | Freq: Two times a day (BID) | ORAL | 0 refills | Status: DC

## 2015-10-13 MED ORDER — CYCLOBENZAPRINE HCL 5 MG PO TABS: 10.0000 mg | ORAL_TABLET | Freq: Three times a day (TID) | ORAL | 0 refills | Status: DC | PRN
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
## 2015-10-13 NOTE — ED Notes (Signed)
Pt to xray at this time.
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
## 2015-10-13 NOTE — Discharge Instructions (Signed)
Read the information below.  Your x-rays are re-assuring. I suspect you may have muscle soreness for the next 2-3 days. I have prescribed naprosyn and flexeril for relief. While taking naprosyn do not take motrin, ibuprofen, or aleve. Flexeril can make you drowsy, do not drive after taking.  You can apply ice or heat to affected areas.  Use the prescribed medication as directed.  Please discuss all new medications with your pharmacist.   Be sure to follow up with your primary provider if symptoms persist for more than 5-7 days. I have provided the contact information for [facility] clinic if you do not have a primary doctor.  You may return to the Emergency Department at any time for worsening condition or any new symptoms that concern you.

## 2016-01-04 ENCOUNTER — Emergency Department (HOSPITAL_COMMUNITY)
Admission: EM | Admit: 2016-01-04 | Discharge: 2016-01-04 | Disposition: A | Discharge status: Home / Self Care | Payer: Medicaid Other | Attending: Emergency Medicine | Admitting: Emergency Medicine

## 2016-01-04 ENCOUNTER — Emergency Department (HOSPITAL_COMMUNITY): Payer: Medicaid Other

## 2016-01-04 ENCOUNTER — Encounter (HOSPITAL_COMMUNITY): Payer: Self-pay

## 2016-01-04 DIAGNOSIS — F17210 Nicotine dependence, cigarettes, uncomplicated: Secondary | ICD-10-CM | POA: Diagnosis not present

## 2016-01-04 DIAGNOSIS — K122 Cellulitis and abscess of mouth: Secondary | ICD-10-CM

## 2016-01-04 DIAGNOSIS — M542 Cervicalgia: Secondary | ICD-10-CM | POA: Diagnosis present

## 2016-01-04 DIAGNOSIS — Z79899 Other long term (current) drug therapy: Secondary | ICD-10-CM | POA: Insufficient documentation

## 2016-01-04 DIAGNOSIS — J45909 Unspecified asthma, uncomplicated: Secondary | ICD-10-CM | POA: Insufficient documentation

## 2016-01-04 DIAGNOSIS — J029 Acute pharyngitis, unspecified: Secondary | ICD-10-CM | POA: Diagnosis not present

## 2016-01-04 LAB — CBC WITH DIFFERENTIAL/PLATELET
BASOS ABS: 0 10*3/uL (ref 0.0–0.1)
BASOS PCT: 0 %
EOS ABS: 0 10*3/uL (ref 0.0–0.7)
EOS PCT: 0 %
HCT: 44 % (ref 39.0–52.0)
HEMOGLOBIN: 14.8 g/dL (ref 13.0–17.0)
Lymphocytes Relative: 11 %
Lymphs Abs: 0.9 10*3/uL (ref 0.7–4.0)
MCH: 32.4 pg (ref 26.0–34.0)
MCHC: 33.6 g/dL (ref 30.0–36.0)
MCV: 96.3 fL (ref 78.0–100.0)
Monocytes Absolute: 0.4 10*3/uL (ref 0.1–1.0)
Monocytes Relative: 5 %
NEUTROS PCT: 84 %
Neutro Abs: 6.6 10*3/uL (ref 1.7–7.7)
PLATELETS: 141 10*3/uL — AB (ref 150–400)
RBC: 4.57 MIL/uL (ref 4.22–5.81)
RDW: 13.6 % (ref 11.5–15.5)
WBC: 7.9 10*3/uL (ref 4.0–10.5)

## 2016-01-04 LAB — BASIC METABOLIC PANEL
Anion gap: 9 (ref 5–15)
BUN: 14 mg/dL (ref 6–20)
CALCIUM: 9.1 mg/dL (ref 8.9–10.3)
CHLORIDE: 103 mmol/L (ref 101–111)
CO2: 25 mmol/L (ref 22–32)
CREATININE: 1.04 mg/dL (ref 0.61–1.24)
Glucose, Bld: 111 mg/dL — ABNORMAL HIGH (ref 65–99)
Potassium: 3.8 mmol/L (ref 3.5–5.1)
SODIUM: 137 mmol/L (ref 135–145)

## 2016-01-04 LAB — RAPID STREP SCREEN (MED CTR MEBANE ONLY): STREPTOCOCCUS, GROUP A SCREEN (DIRECT): NEGATIVE

## 2016-01-04 MED ORDER — HYDROCODONE-ACETAMINOPHEN 5-325 MG PO TABS: 1.0000 | ORAL_TABLET | Freq: Four times a day (QID) | ORAL | 0 refills | Status: DC | PRN

## 2016-01-04 MED ORDER — IOPAMIDOL (ISOVUE-300) INJECTION 61%
100.0000 mL | Freq: Once | INTRAVENOUS | Status: AC | PRN
  Administered 2016-01-04: 75 mL via INTRAVENOUS

## 2016-01-04 MED ORDER — SODIUM CHLORIDE 0.9 % IV SOLN
3.0000 g | Freq: Once | INTRAVENOUS | Status: AC
  Administered 2016-01-04: 3 g via INTRAVENOUS
  Filled 2016-01-04: qty 3

## 2016-01-04 MED ORDER — AMOXICILLIN 500 MG PO CAPS: 500.0000 mg | ORAL_CAPSULE | Freq: Two times a day (BID) | ORAL | 0 refills | Status: DC

## 2016-01-04 MED ORDER — DEXAMETHASONE SODIUM PHOSPHATE 10 MG/ML IJ SOLN
10.0000 mg | Freq: Once | INTRAMUSCULAR | Status: AC
  Administered 2016-01-04: 10 mg via INTRAVENOUS
  Filled 2016-01-04: qty 1

## 2016-01-04 MED ORDER — SODIUM CHLORIDE 0.9 % IV BOLUS (SEPSIS)
1000.0000 mL | Freq: Once | INTRAVENOUS | Status: AC
  Administered 2016-01-04: 1000 mL via INTRAVENOUS

## 2016-01-04 MED ORDER — KETOROLAC TROMETHAMINE 30 MG/ML IJ SOLN
15.0000 mg | Freq: Once | INTRAMUSCULAR | Status: AC
  Administered 2016-01-04: 15 mg via INTRAVENOUS
  Filled 2016-01-04: qty 1

## 2016-01-04 NOTE — ED Triage Notes (Signed)
Pt reports sore throat x 3 hours, states feels like something blocking his throat on the inside.  Pt has redness, swelling, and white patches on tonsils

## 2016-01-04 NOTE — Discharge Instructions (Signed)
Return to the ER immediately if you develop worsening swelling, trouble talking or breathing, or if you are unable to swallow.

## 2016-01-04 NOTE — ED Notes (Signed)
Patient to CT.

## 2016-01-04 NOTE — ED Notes (Signed)
Pt alert & oriented x4, stable gait. Patient given discharge instructions, paperwork & prescription(s). Patient informed not to drive, operate any equipment & handel any important documents 4 hours after taking pain medication. Patient instructed to stop at the registration desk to finish any additional paperwork. Patient verbalized understanding. Pt left department w/ no further questions.

## 2016-01-04 NOTE — ED Provider Notes (Signed)
[place] DEPT Provider Note   CSN: [id number] Arrival date & time: 01/04/16  0258     History   Chief Complaint Chief Complaint  Patient presents with  . Sore Throat    HPI Sean Mcmahon is a 45 y.o. male.  HPI  45 year old male presents with sudden onset sore throat and swelling. He states he drank hot soup around 4 PM yesterday and felt a little tingling and burning in his throat. Then at around 11 PM or midnight he woke up with sudden onset pain. He feels like he needs to pull the anterior neck skin tissue to help breathe. He feels like his voice has changed. Feels like something is swollen in the back of his throat. No vomiting. No fevers. No known history of diabetes or other immunocompromise. Tried to drink water which went down but was painful.  Past Medical History:  Diagnosis Date  . Asthma     There are no active problems to display for this patient.   History reviewed. No pertinent surgical history.     Home Medications    Prior to Admission medications   Medication Sig Start Date End Date Taking? Authorizing Provider  amoxicillin (AMOXIL) 500 MG capsule Take 1 capsule (500 mg total) by mouth 2 (two) times daily. 01/04/16   Sherwood Gambler, MD  benzonatate (TESSALON) 100 MG capsule Take 1 capsule (100 mg total) by mouth every 8 (eight) hours. 08/04/14   Hannah Muthersbaugh, PA-C  cyclobenzaprine (FLEXERIL) 5 MG tablet Take 2 tablets (10 mg total) by mouth 3 (three) times daily as needed for muscle spasms. 10/13/15   Roxanna Mew, PA-C  fluticasone [facility]) 50 MCG/ACT nasal spray Place 2 sprays into both nostrils daily. 08/04/14   Hannah Muthersbaugh, PA-C  HYDROcodone-acetaminophen (NORCO/VICODIN) 5-325 MG tablet Take 1-2 tablets by mouth every 6 (six) hours as needed for severe pain. 01/04/16   Sherwood Gambler, MD  naproxen (NAPROSYN) 500 MG tablet Take 1 tablet (500 mg total) by mouth 2 (two) times daily. 10/13/15   Roxanna Mew, PA-C    traMADol (ULTRAM) 50 MG tablet Take 1 tablet (50 mg total) by mouth every 6 (six) hours as needed. 04/20/15   Bjorn Pippin, PA-C    Family History No family history on file.  Social History Social History  Substance Use Topics  . Smoking status: Current Some Day Smoker    Packs/day: 0.25    Years: 12.00    Types: Cigarettes  . Smokeless tobacco: Never Used  . Alcohol use No     Allergies   Bee venom   Review of Systems Review of Systems  Constitutional: Negative for fever.  HENT: Positive for sore throat, trouble swallowing and voice change.   Respiratory: Positive for shortness of breath.   Gastrointestinal: Negative for vomiting.  All other systems reviewed and are negative.    Physical Exam Updated Vital Signs BP 133/89 (BP Location: Left Arm)   Pulse 81   Temp 98.4 F (36.9 C) (Oral)   Resp 16   Ht 6' (1.829 m)   Wt 215 lb (97.5 kg)   SpO2 98%   BMI 29.16 kg/m   Physical Exam  Constitutional: He is oriented to person, place, and time. He appears well-developed and well-nourished. No distress.  HENT:  Head: Normocephalic and atraumatic.  Right Ear: External ear normal.  Left Ear: External ear normal.  Nose: Nose normal.  Mouth/Throat: Uvula is midline and mucous membranes are normal. No trismus in the  jaw. Uvula swelling present. No tonsillar abscesses. Tonsils are 2+ on the right. Tonsils are 1+ on the left. Tonsillar exudate (right sided and uvula).  Uvula very swollen/edematous. Midline. Right tonsil slightly larger than left with some white discharge, unclear if from uvula or tonsil. Voice slightly muffled but no "hot potato" voice or distress. No trismus. No drooling  Eyes: Right eye exhibits no discharge. Left eye exhibits no discharge.  Neck: Neck supple.  Cardiovascular: Normal rate, regular rhythm and normal heart sounds.   Pulmonary/Chest: Effort normal and breath sounds normal. No stridor.  Abdominal: He exhibits no distension.   Musculoskeletal: He exhibits no edema.  Neurological: He is alert and oriented to person, place, and time.  Skin: Skin is warm and dry. He is not diaphoretic.  Nursing note and vitals reviewed.    ED Treatments / Results  Labs (all labs ordered are listed, but only abnormal results are displayed) Labs Reviewed  BASIC METABOLIC PANEL - Abnormal; Notable for the following:       Result Value   Glucose, Bld 111 (*)    All other components within normal limits  CBC WITH DIFFERENTIAL/PLATELET - Abnormal; Notable for the following:    Platelets 141 (*)    All other components within normal limits  RAPID STREP SCREEN (NOT AT [facility])  CULTURE, GROUP A STREP [facility])    EKG  EKG Interpretation None       Radiology Ct Soft Tissue Neck W Contrast  Result Date: 01/04/2016 CLINICAL DATA:  Sore throat and difficulty swallowing EXAM: CT NECK WITH CONTRAST TECHNIQUE: Multidetector CT imaging of the neck was performed using the standard protocol following the bolus administration of intravenous contrast. CONTRAST:  60m ISOVUE-300 IOPAMIDOL (ISOVUE-300) INJECTION 61% COMPARISON:  None. FINDINGS: Pharynx and larynx: The nasopharynx is clear. The uvula is markedly edematous. The palatine tonsils are enlarged. The hypopharynx is normal. There is no retropharyngeal adenopathy, effusion or abscess. There is no airway compromise. The epiglottis and larynx are normal. Salivary glands: The parotid and submandibular glands are normal. No sialolithiasis or salivary ductal dilatation. Thyroid: The thyroid gland is normal. Hyperdensity at the base of tongue, at the foramen cecum, is suspected to be a small lingual thyroid. Lymph nodes: There are no enlarged or abnormal density cervical lymph nodes. Vascular: The major vessels are patent and of normal caliber. Limited intracranial: Normal Visualized orbits: Normal Mastoids and visualized paranasal sinuses: Clear Skeleton: There is no bony spinal canal stenosis. No  lytic or blastic lesions. There is marked osteophytosis at C4-C7. Upper chest: Clear Other: None IMPRESSION: 1. Marked edema of the uvula and moderately enlarged palatine tonsils is most suggestive of pharyngitis. 2. No peritonsillar or retropharyngeal fluid collection or abscess. Electronically Signed   By: KUlyses JarredM.D.   On: 01/04/2016 05:20    Procedures Procedures (including critical care time)  Medications Ordered in ED Medications  dexamethasone (DECADRON) injection 10 mg (10 mg Intravenous Given 01/04/16 0442)  sodium chloride 0.9 % bolus 1,000 mL (0 mLs Intravenous Stopped 01/04/16 0554)  ketorolac (TORADOL) 30 MG/ML injection 15 mg (15 mg Intravenous Given 01/04/16 0440)  Ampicillin-Sulbactam (UNASYN) 3 g in sodium chloride 0.9 % 100 mL IVPB (0 g Intravenous Stopped 01/04/16 0516)  iopamidol (ISOVUE-300) 61 % injection 100 mL (75 mLs Intravenous Contrast Given 01/04/16 0429)     Initial Impression / Assessment and Plan / ED Course  I have reviewed the triage vital signs and the nursing notes.  Pertinent labs & imaging results  that were available during my care of the patient were reviewed by me and considered in my medical decision making (see chart for details).  Clinical Course as of Jan 04 639  Fri Jan 04, 2016  1062 This is most likely uvulitis. However, his right tonsil also appears a little larger with some exudate. He is not in distress. Given his voice change (probably the uvula) will get CT scan for better definition of his oropharyngeal process. IV Decadron, IV Toradol, and 1 dose of IV antibiotics.  [SG]  [id number] Patient feels slight improvement. He is resting comfortably. CT scan consistent with pharyngitis and uvulitis without abscess formation or deep space infection. Given he is able to swallow, will treat with oral antibiotics, oral pain control, and follow-up with PCP. Strict return precautions.  [SG]    Clinical Course User Index [SG] Sherwood Gambler, MD     Final Clinical Impressions(s) / ED Diagnoses   Final diagnoses:  Uvulitis  Pharyngitis, unspecified etiology    New Prescriptions Discharge Medication List as of 01/04/2016  5:41 AM    START taking these medications   Details  amoxicillin (AMOXIL) 500 MG capsule Take 1 capsule (500 mg total) by mouth 2 (two) times daily., Starting Fri 01/04/2016, Print    HYDROcodone-acetaminophen (NORCO/VICODIN) 5-325 MG tablet Take 1-2 tablets by mouth every 6 (six) hours as needed for severe pain., Starting Fri 01/04/2016, Print         Sherwood Gambler, MD 01/04/16 [phone]

## 2016-01-06 LAB — CULTURE, GROUP A STREP (THRC)

## 2016-03-23 ENCOUNTER — Emergency Department (HOSPITAL_COMMUNITY): Payer: Medicaid Other

## 2016-03-23 ENCOUNTER — Encounter (HOSPITAL_COMMUNITY): Payer: Self-pay | Admitting: Emergency Medicine

## 2016-03-23 ENCOUNTER — Emergency Department (HOSPITAL_COMMUNITY)
Admission: EM | Admit: 2016-03-23 | Discharge: 2016-03-23 | Disposition: A | Discharge status: Home / Self Care | Payer: Medicaid Other | Attending: Emergency Medicine | Admitting: Emergency Medicine

## 2016-03-23 DIAGNOSIS — F17210 Nicotine dependence, cigarettes, uncomplicated: Secondary | ICD-10-CM | POA: Insufficient documentation

## 2016-03-23 DIAGNOSIS — J45909 Unspecified asthma, uncomplicated: Secondary | ICD-10-CM | POA: Diagnosis not present

## 2016-03-23 DIAGNOSIS — Y9389 Activity, other specified: Secondary | ICD-10-CM | POA: Diagnosis not present

## 2016-03-23 DIAGNOSIS — S62396A Other fracture of fifth metacarpal bone, right hand, initial encounter for closed fracture: Secondary | ICD-10-CM | POA: Diagnosis not present

## 2016-03-23 DIAGNOSIS — Y929 Unspecified place or not applicable: Secondary | ICD-10-CM | POA: Insufficient documentation

## 2016-03-23 DIAGNOSIS — S6991XA Unspecified injury of right wrist, hand and finger(s), initial encounter: Secondary | ICD-10-CM | POA: Diagnosis present

## 2016-03-23 DIAGNOSIS — Z79899 Other long term (current) drug therapy: Secondary | ICD-10-CM | POA: Diagnosis not present

## 2016-03-23 DIAGNOSIS — Y999 Unspecified external cause status: Secondary | ICD-10-CM | POA: Insufficient documentation

## 2016-03-23 DIAGNOSIS — S62339A Displaced fracture of neck of unspecified metacarpal bone, initial encounter for closed fracture: Secondary | ICD-10-CM

## 2016-03-23 MED ORDER — HYDROCODONE-ACETAMINOPHEN 5-325 MG PO TABS
1.0000 | ORAL_TABLET | Freq: Once | ORAL | Status: AC
Start: 2016-03-23 — End: 2016-03-23
  Administered 2016-03-23: 1 via ORAL
  Filled 2016-03-23: qty 1

## 2016-03-23 MED ORDER — HYDROCODONE-ACETAMINOPHEN 5-325 MG PO TABS: 1.0000 | ORAL_TABLET | ORAL | 0 refills | Status: DC | PRN

## 2016-03-23 NOTE — Discharge Instructions (Signed)
You may take the hydrocodone prescribed for pain relief.  This will make you drowsy - do not drive within 4 hours of taking this medication.
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
## 2016-03-25 NOTE — ED Notes (Signed)
03/25/2016, 11:56 Am   Pt. Called and requested discharge information for follow-up care.  Information given per discharge instructions.

## 2017-04-03 ENCOUNTER — Encounter (HOSPITAL_COMMUNITY): Payer: Self-pay | Admitting: Pharmacy Technician

## 2017-04-03 ENCOUNTER — Emergency Department (HOSPITAL_COMMUNITY): Payer: Medicaid Other

## 2017-04-03 ENCOUNTER — Emergency Department (HOSPITAL_COMMUNITY)
Admission: EM | Admit: 2017-04-03 | Discharge: 2017-04-03 | Disposition: A | Discharge status: Home / Self Care | Payer: Medicaid Other | Attending: Physician Assistant | Admitting: Physician Assistant

## 2017-04-03 DIAGNOSIS — M79642 Pain in left hand: Secondary | ICD-10-CM | POA: Insufficient documentation

## 2017-04-03 DIAGNOSIS — F17210 Nicotine dependence, cigarettes, uncomplicated: Secondary | ICD-10-CM | POA: Diagnosis not present

## 2017-04-03 DIAGNOSIS — M549 Dorsalgia, unspecified: Secondary | ICD-10-CM | POA: Diagnosis not present

## 2017-04-03 DIAGNOSIS — J45909 Unspecified asthma, uncomplicated: Secondary | ICD-10-CM | POA: Diagnosis not present

## 2017-04-03 DIAGNOSIS — Z79899 Other long term (current) drug therapy: Secondary | ICD-10-CM | POA: Insufficient documentation

## 2017-04-03 DIAGNOSIS — R079 Chest pain, unspecified: Secondary | ICD-10-CM | POA: Insufficient documentation

## 2017-04-03 DIAGNOSIS — M25512 Pain in left shoulder: Secondary | ICD-10-CM | POA: Diagnosis not present

## 2017-04-03 DIAGNOSIS — Y939 Activity, unspecified: Secondary | ICD-10-CM | POA: Diagnosis not present

## 2017-04-03 DIAGNOSIS — Y929 Unspecified place or not applicable: Secondary | ICD-10-CM | POA: Insufficient documentation

## 2017-04-03 DIAGNOSIS — R51 Headache: Secondary | ICD-10-CM | POA: Insufficient documentation

## 2017-04-03 DIAGNOSIS — M542 Cervicalgia: Secondary | ICD-10-CM | POA: Insufficient documentation

## 2017-04-03 DIAGNOSIS — Y999 Unspecified external cause status: Secondary | ICD-10-CM | POA: Diagnosis not present

## 2017-04-03 LAB — COMPREHENSIVE METABOLIC PANEL
ALBUMIN: 4.1 g/dL (ref 3.5–5.0)
ALT: 23 U/L (ref 17–63)
AST: 36 U/L (ref 15–41)
Alkaline Phosphatase: 44 U/L (ref 38–126)
Anion gap: 12 (ref 5–15)
BUN: 11 mg/dL (ref 6–20)
CHLORIDE: 105 mmol/L (ref 101–111)
CO2: 23 mmol/L (ref 22–32)
CREATININE: 1.1 mg/dL (ref 0.61–1.24)
Calcium: 9.1 mg/dL (ref 8.9–10.3)
GFR calc Af Amer: >60 mL/min (ref >60)
GLUCOSE: 118 mg/dL — AB (ref 65–99)
Potassium: 3.7 mmol/L (ref 3.5–5.1)
Sodium: 140 mmol/L (ref 135–145)
Total Bilirubin: 0.9 mg/dL (ref 0.3–1.2)
Total Protein: 7 g/dL (ref 6.5–8.1)

## 2017-04-03 LAB — RAPID URINE DRUG SCREEN, HOSP PERFORMED
Amphetamines: NOT DETECTED
BARBITURATES: NOT DETECTED
Benzodiazepines: NOT DETECTED
COCAINE: NOT DETECTED
Opiates: NOT DETECTED
Tetrahydrocannabinol: NOT DETECTED

## 2017-04-03 LAB — URINALYSIS, ROUTINE W REFLEX MICROSCOPIC
Bilirubin Urine: NEGATIVE
Glucose, UA: NEGATIVE mg/dL
Ketones, ur: NEGATIVE mg/dL
LEUKOCYTES UA: NEGATIVE
Nitrite: NEGATIVE
PH: 5 (ref 5.0–8.0)
Protein, ur: NEGATIVE mg/dL
SPECIFIC GRAVITY, URINE: 1.025 (ref 1.005–1.030)

## 2017-04-03 LAB — I-STAT TROPONIN, ED: Troponin i, poc: 0.01 ng/mL (ref 0.00–0.08)

## 2017-04-03 LAB — CBC
HCT: 46 % (ref 39.0–52.0)
Hemoglobin: 15.4 g/dL (ref 13.0–17.0)
MCH: 32.4 pg (ref 26.0–34.0)
MCHC: 33.5 g/dL (ref 30.0–36.0)
MCV: 96.8 fL (ref 78.0–100.0)
PLATELETS: 134 10*3/uL — AB (ref 150–400)
RBC: 4.75 MIL/uL (ref 4.22–5.81)
RDW: 13.3 % (ref 11.5–15.5)
WBC: 8.5 10*3/uL (ref 4.0–10.5)

## 2017-04-03 MED ORDER — IBUPROFEN 600 MG PO TABS: 600.0000 mg | ORAL_TABLET | Freq: Four times a day (QID) | ORAL | 0 refills | Status: DC | PRN

## 2017-04-03 MED ORDER — CYCLOBENZAPRINE HCL 10 MG PO TABS
10.0000 mg | ORAL_TABLET | Freq: Once | ORAL | Status: AC
  Administered 2017-04-03: 10 mg via ORAL
  Filled 2017-04-03: qty 1

## 2017-04-03 MED ORDER — CYCLOBENZAPRINE HCL 10 MG PO TABS: 10.0000 mg | ORAL_TABLET | Freq: Two times a day (BID) | ORAL | 0 refills | Status: AC | PRN

## 2017-04-03 MED ORDER — FENTANYL CITRATE (PF) 100 MCG/2ML IJ SOLN
100.0000 ug | Freq: Once | INTRAMUSCULAR | Status: AC
  Administered 2017-04-03: 100 ug via INTRAVENOUS
  Filled 2017-04-03: qty 2

## 2017-04-03 NOTE — ED Notes (Signed)
Pt ambulated in hallway
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
## 2017-04-06 ENCOUNTER — Other Ambulatory Visit: Payer: Self-pay

## 2017-04-06 ENCOUNTER — Emergency Department (HOSPITAL_COMMUNITY)
Admission: EM | Admit: 2017-04-06 | Discharge: 2017-04-06 | Disposition: A | Discharge status: Home / Self Care | Payer: Medicaid Other | Attending: Emergency Medicine | Admitting: Emergency Medicine

## 2017-04-06 ENCOUNTER — Emergency Department (HOSPITAL_COMMUNITY): Payer: Medicaid Other

## 2017-04-06 ENCOUNTER — Encounter (HOSPITAL_COMMUNITY): Payer: Self-pay | Admitting: Emergency Medicine

## 2017-04-06 DIAGNOSIS — Y9389 Activity, other specified: Secondary | ICD-10-CM | POA: Diagnosis not present

## 2017-04-06 DIAGNOSIS — S2232XA Fracture of one rib, left side, initial encounter for closed fracture: Secondary | ICD-10-CM | POA: Diagnosis not present

## 2017-04-06 DIAGNOSIS — Y999 Unspecified external cause status: Secondary | ICD-10-CM | POA: Diagnosis not present

## 2017-04-06 DIAGNOSIS — J45909 Unspecified asthma, uncomplicated: Secondary | ICD-10-CM | POA: Insufficient documentation

## 2017-04-06 DIAGNOSIS — F17210 Nicotine dependence, cigarettes, uncomplicated: Secondary | ICD-10-CM | POA: Diagnosis not present

## 2017-04-06 DIAGNOSIS — Y929 Unspecified place or not applicable: Secondary | ICD-10-CM | POA: Diagnosis not present

## 2017-04-06 DIAGNOSIS — Z79899 Other long term (current) drug therapy: Secondary | ICD-10-CM | POA: Insufficient documentation

## 2017-04-06 DIAGNOSIS — S20302A Unspecified superficial injuries of left front wall of thorax, initial encounter: Secondary | ICD-10-CM | POA: Diagnosis present

## 2017-04-06 MED ORDER — HYDROCODONE-ACETAMINOPHEN 5-325 MG PO TABS
1.0000 | ORAL_TABLET | Freq: Once | ORAL | Status: AC
  Administered 2017-04-06: 1 via ORAL
  Filled 2017-04-06: qty 1

## 2017-04-06 MED ORDER — HYDROCODONE-ACETAMINOPHEN 5-325 MG PO TABS: 1.0000 | ORAL_TABLET | ORAL | 0 refills | Status: DC | PRN
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
## 2017-04-06 NOTE — ED Notes (Signed)
Updated pt on wait time. Verbalized understanding
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
## 2017-04-06 NOTE — Discharge Instructions (Addendum)
You may take the hydrocodone prescribed for pain relief.  This will make you drowsy - do not drive within 4 hours of taking this medication. Use the incentive spirometer as discussed to help minimize the risk of developing pneumonia from this injury. Return here for any fevers, increased shortness of breath or any new symptoms.

## 2017-04-14 ENCOUNTER — Emergency Department (HOSPITAL_COMMUNITY)
Admission: EM | Admit: 2017-04-14 | Discharge: 2017-04-14 | Disposition: A | Discharge status: Home / Self Care | Payer: Medicaid Other | Attending: Emergency Medicine | Admitting: Emergency Medicine

## 2017-04-14 ENCOUNTER — Other Ambulatory Visit: Payer: Self-pay

## 2017-04-14 ENCOUNTER — Encounter (HOSPITAL_COMMUNITY): Payer: Self-pay | Admitting: Emergency Medicine

## 2017-04-14 DIAGNOSIS — S2232XD Fracture of one rib, left side, subsequent encounter for fracture with routine healing: Secondary | ICD-10-CM | POA: Insufficient documentation

## 2017-04-14 DIAGNOSIS — Z79899 Other long term (current) drug therapy: Secondary | ICD-10-CM | POA: Insufficient documentation

## 2017-04-14 DIAGNOSIS — J45909 Unspecified asthma, uncomplicated: Secondary | ICD-10-CM | POA: Insufficient documentation

## 2017-04-14 DIAGNOSIS — F17210 Nicotine dependence, cigarettes, uncomplicated: Secondary | ICD-10-CM | POA: Diagnosis not present

## 2017-04-14 DIAGNOSIS — Z760 Encounter for issue of repeat prescription: Secondary | ICD-10-CM | POA: Diagnosis not present

## 2017-04-14 MED ORDER — HYDROCODONE-ACETAMINOPHEN 5-325 MG PO TABS: ORAL_TABLET | ORAL | 0 refills | Status: AC

## 2017-04-14 MED ORDER — NAPROXEN 500 MG PO TABS: 500.0000 mg | ORAL_TABLET | Freq: Two times a day (BID) | ORAL | 0 refills | Status: AC
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
## 2017-04-14 NOTE — Discharge Instructions (Addendum)
Use a pillow or folded tile held to your side with firm pressure to cough and take deep breaths several times a day.  You may apply ice packs on and off.  Follow-up with 1 of the providers listed to establish primary care and arrange for follow-up.  Your blood pressure was slightly elevated today this will also need to be rechecked by your primary care provider.  Return to the ER for any worsening symptoms.
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
## 2017-10-25 ENCOUNTER — Emergency Department (HOSPITAL_COMMUNITY): Payer: Medicaid Other

## 2017-10-25 ENCOUNTER — Other Ambulatory Visit: Payer: Self-pay

## 2017-10-25 ENCOUNTER — Encounter (HOSPITAL_COMMUNITY): Payer: Self-pay

## 2017-10-25 ENCOUNTER — Emergency Department (HOSPITAL_COMMUNITY)
Admission: EM | Admit: 2017-10-25 | Discharge: 2017-10-25 | Disposition: A | Discharge status: Home / Self Care | Payer: Medicaid Other | Attending: Emergency Medicine | Admitting: Emergency Medicine

## 2017-10-25 DIAGNOSIS — S0990XA Unspecified injury of head, initial encounter: Secondary | ICD-10-CM | POA: Diagnosis present

## 2017-10-25 DIAGNOSIS — S8391XA Sprain of unspecified site of right knee, initial encounter: Secondary | ICD-10-CM | POA: Insufficient documentation

## 2017-10-25 DIAGNOSIS — W01198A Fall on same level from slipping, tripping and stumbling with subsequent striking against other object, initial encounter: Secondary | ICD-10-CM | POA: Diagnosis not present

## 2017-10-25 DIAGNOSIS — Y999 Unspecified external cause status: Secondary | ICD-10-CM | POA: Insufficient documentation

## 2017-10-25 DIAGNOSIS — S0101XA Laceration without foreign body of scalp, initial encounter: Secondary | ICD-10-CM | POA: Diagnosis not present

## 2017-10-25 DIAGNOSIS — Y92009 Unspecified place in unspecified non-institutional (private) residence as the place of occurrence of the external cause: Secondary | ICD-10-CM

## 2017-10-25 DIAGNOSIS — Y9389 Activity, other specified: Secondary | ICD-10-CM | POA: Diagnosis not present

## 2017-10-25 DIAGNOSIS — W19XXXA Unspecified fall, initial encounter: Secondary | ICD-10-CM

## 2017-10-25 DIAGNOSIS — Y92017 Garden or yard in single-family (private) house as the place of occurrence of the external cause: Secondary | ICD-10-CM | POA: Insufficient documentation

## 2017-10-25 DIAGNOSIS — J45909 Unspecified asthma, uncomplicated: Secondary | ICD-10-CM | POA: Diagnosis not present

## 2017-10-25 DIAGNOSIS — Z79899 Other long term (current) drug therapy: Secondary | ICD-10-CM | POA: Diagnosis not present

## 2017-10-25 DIAGNOSIS — F17210 Nicotine dependence, cigarettes, uncomplicated: Secondary | ICD-10-CM | POA: Insufficient documentation

## 2017-10-25 MED ORDER — LIDOCAINE-EPINEPHRINE (PF) 2 %-1:200000 IJ SOLN
10.0000 mL | Freq: Once | INTRAMUSCULAR | Status: AC
  Administered 2017-10-25: 10 mL
  Filled 2017-10-25: qty 20
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
## 2017-10-25 NOTE — Discharge Instructions (Addendum)
Use ice over the painful areas for comfort. You can have acetaminophen 650 mg + motrin 600 mg every 6 hrs for pain as needed. You should wear the knee immobilizer to stabilize your knee. You can take it off to take a shower. If your knee is still painful in a week, call Dr Ruthe Mannan office to have him recheck your knee for an internal injury. Dr Aline Brochure is an orthopedist or bone specialist.  Return to the ED in 1 week to have your staples removed, return sooner for any problems listed on the head injury sheet.

## 2017-10-25 NOTE — ED Notes (Signed)
Patient transported to X-ray 
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
## 2017-10-25 NOTE — ED Notes (Signed)
ED Provider at bedside. 

## 2017-10-25 NOTE — ED Triage Notes (Signed)
Pt arrived from home via POV and present intoxicated. Per Pts family in waiting room, Pt fell and is complaining of posterior head pain and right leg pain. Pt presents with 2cm laceration with bleeding under control at this time.

## 2017-11-02 ENCOUNTER — Other Ambulatory Visit: Payer: Self-pay

## 2017-11-02 ENCOUNTER — Emergency Department (HOSPITAL_COMMUNITY)
Admission: EM | Admit: 2017-11-02 | Discharge: 2017-11-02 | Disposition: A | Discharge status: Home / Self Care | Payer: Medicaid Other | Attending: Emergency Medicine | Admitting: Emergency Medicine

## 2017-11-02 ENCOUNTER — Encounter (HOSPITAL_COMMUNITY): Payer: Self-pay | Admitting: Emergency Medicine

## 2017-11-02 DIAGNOSIS — S0101XD Laceration without foreign body of scalp, subsequent encounter: Secondary | ICD-10-CM | POA: Insufficient documentation

## 2017-11-02 DIAGNOSIS — W19XXXD Unspecified fall, subsequent encounter: Secondary | ICD-10-CM | POA: Diagnosis not present

## 2017-11-02 DIAGNOSIS — Z4802 Encounter for removal of sutures: Secondary | ICD-10-CM | POA: Diagnosis present

## 2017-11-02 DIAGNOSIS — Z23 Encounter for immunization: Secondary | ICD-10-CM | POA: Diagnosis not present

## 2017-11-02 DIAGNOSIS — F17210 Nicotine dependence, cigarettes, uncomplicated: Secondary | ICD-10-CM | POA: Insufficient documentation

## 2017-11-02 DIAGNOSIS — J45909 Unspecified asthma, uncomplicated: Secondary | ICD-10-CM | POA: Diagnosis not present

## 2017-11-02 MED ORDER — TETANUS-DIPHTH-ACELL PERTUSSIS 5-2.5-18.5 LF-MCG/0.5 IM SUSP
0.5000 mL | Freq: Once | INTRAMUSCULAR | Status: AC
  Administered 2017-11-02: 0.5 mL via INTRAMUSCULAR
  Filled 2017-11-02: qty 0.5

## 2017-11-02 NOTE — Discharge Instructions (Signed)
Your tetanus vaccination was updated at today's visit. Please keep this area covered when out in the sun to avoid scarring. Return to the ER for any new or worsening symptoms or any other concerns.

## 2017-11-02 NOTE — ED Triage Notes (Signed)
Pt needs to have staples removed from back of head

## 2017-11-02 NOTE — ED Provider Notes (Signed)
[facility] EMERGENCY DEPARTMENT Provider Note   CSN: [id number] Arrival date & time: 11/02/17  1236     History   Chief Complaint Chief Complaint  Patient presents with  . Suture / Staple Removal    HPI Sean Mcmahon is a 47 y.o. male with a history of tobacco abuse who presents to the emergency department for staple removal today. Patient was seen in the ED 10/20- s/p fall w/ head injury. Had 3 staples placed to right posterior scalp. States he has been doing well since last visit. No specific alleviating/aggravating factors. Denies drainage from wound, redness/warmth, or fevers. He states last tetanus was > 5 years ago.   HPI  Past Medical History:  Diagnosis Date  . Asthma     There are no active problems to display for this patient.   History reviewed. No pertinent surgical history.      Home Medications    Prior to Admission medications   Medication Sig Start Date End Date Taking? Authorizing Provider  albuterol (PROVENTIL HFA;VENTOLIN HFA) 108 (90 Base) MCG/ACT inhaler Inhale 2 puffs into the lungs every 6 (six) hours as needed for wheezing (allergies).    [provider]  benzonatate (TESSALON) 100 MG capsule Take 1 capsule (100 mg total) by mouth every 8 (eight) hours. Patient not taking: Reported on 04/03/2017 08/04/14   Muthersbaugh, Jarrett Soho, PA-C  cyclobenzaprine (FLEXERIL) 10 MG tablet Take 1 tablet (10 mg total) by mouth 2 (two) times daily as needed for muscle spasms. 04/03/17   McDonald, Mia A, PA-C  fluticasone (FLONASE) 50 MCG/ACT nasal spray Place 2 sprays into both nostrils daily. Patient taking differently: Place 2 sprays into both nostrils daily as needed for allergies.  08/04/14   Muthersbaugh, Jarrett Soho, PA-C  HYDROcodone-acetaminophen (NORCO/VICODIN) 5-325 MG tablet Take one tab po q 4 hrs prn pain 04/14/17   Triplett, Tammy, PA-C  naproxen (NAPROSYN) 500 MG tablet Take 1 tablet (500 mg total) by mouth 2 (two) times daily with a meal. 04/14/17    Triplett, Tammy, PA-C  Vitamin D, Ergocalciferol, (DRISDOL) 50000 units CAPS capsule Take 50,000 Units by mouth every Sunday. 03/19/17   [provider]    Family History No family history on file.  Social History Social History   Tobacco Use  . Smoking status: Current Some Day Smoker    Packs/day: 0.25    Years: 12.00    Pack years: 3.00    Types: Cigarettes  . Smokeless tobacco: Never Used  Substance Use Topics  . Alcohol use: Yes    Alcohol/week: 0.0 standard drinks  . Drug use: No     Allergies   Bee venom and Other   Review of Systems Review of Systems  Constitutional: Negative for chills and fever.  Skin: Positive for wound. Negative for color change.     Physical Exam Updated Vital Signs BP 113/78 (BP Location: Right Arm)   Pulse 99   Temp 98.1 F (36.7 C) (Oral)   Resp 18   Ht 6' 1"  (1.854 m)   Wt 97.5 kg   SpO2 97%   BMI 28.37 kg/m   Physical Exam  Constitutional: He appears well-developed and well-nourished. No distress.  HENT:  Head: Normocephalic and atraumatic.    Eyes: Conjunctivae are normal. Right eye exhibits no discharge. Left eye exhibits no discharge.  Neurological: He is alert.  Clear speech.   Psychiatric: He has a normal mood and affect. His behavior is normal. Thought content normal.  Nursing note and  vitals reviewed.    ED Treatments / Results  Labs (all labs ordered are listed, but only abnormal results are displayed) Labs Reviewed - No data to display  EKG None  Radiology No results found.  Procedures .Suture Removal Date/Time: 11/02/2017 1:17 PM Performed by: Amaryllis Dyke, PA-C Authorized by: Amaryllis Dyke, PA-C   Consent:    Consent obtained:  Verbal   Consent given by:  Patient   Risks discussed:  Bleeding, wound separation and pain   Alternatives discussed:  No treatment Location:    Location:  Head/neck   Head/neck location:  Scalp Procedure details:    Wound appearance:   No signs of infection   Number of staples removed:  3 Post-procedure details:    Patient tolerance of procedure:  Tolerated well, no immediate complications   (including critical care time)  Medications Ordered in ED Medications  Tdap (BOOSTRIX) injection 0.5 mL (has no administration in time range)     Initial Impression / Assessment and Plan / ED Course  I have reviewed the triage vital signs and the nursing notes.  Pertinent labs & imaging results that were available during my care of the patient were reviewed by me and considered in my medical decision making (see chart for details).   Patient presents for staple removal-performed per procedure note above, tolerated well. No signs of infection. Scar minimization discussed. Tetanus updated today. . Provided opportunity for questions, patient confirmed understanding and is in agreement with plan.    Final Clinical Impressions(s) / ED Diagnoses   Final diagnoses:  Encounter for staple removal    ED Discharge Orders    None       Amaryllis Dyke, PA-C 11/02/17 [place], [place], DO 11/03/17 2031
